# Patient Record
Sex: Female | Born: 1995 | Race: Black or African American | Hispanic: No | Marital: Single | Smoking: Former smoker
Health system: Southern US, Community
[De-identification: ages and names within clinical notes are randomized; demographics above are authoritative.]

## PROBLEM LIST (undated history)

## (undated) ENCOUNTER — Inpatient Hospital Stay (HOSPITAL_COMMUNITY): Payer: Self-pay

## (undated) DIAGNOSIS — D649 Anemia, unspecified: Secondary | ICD-10-CM

## (undated) DIAGNOSIS — A549 Gonococcal infection, unspecified: Secondary | ICD-10-CM

## (undated) DIAGNOSIS — R45851 Suicidal ideations: Secondary | ICD-10-CM

## (undated) DIAGNOSIS — E669 Obesity, unspecified: Secondary | ICD-10-CM

## (undated) DIAGNOSIS — F319 Bipolar disorder, unspecified: Secondary | ICD-10-CM

## (undated) HISTORY — DX: Suicidal ideations: R45.851

## (undated) HISTORY — DX: Gonococcal infection, unspecified: A54.9

## (undated) HISTORY — PX: NO PAST SURGERIES: SHX2092

---
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
## 2013-03-08 LAB — OB RESULTS CONSOLE RPR: RPR: NONREACTIVE

## 2013-03-08 LAB — OB RESULTS CONSOLE HIV ANTIBODY (ROUTINE TESTING): HIV: NONREACTIVE

## 2013-03-08 LAB — OB RESULTS CONSOLE GC/CHLAMYDIA: Gonorrhea: POSITIVE

## 2013-06-09 ENCOUNTER — Encounter (HOSPITAL_COMMUNITY): Payer: Self-pay | Admitting: *Deleted

## 2013-06-09 ENCOUNTER — Inpatient Hospital Stay (HOSPITAL_COMMUNITY)
Admission: AD | Admit: 2013-06-09 | Discharge: 2013-06-09 | Disposition: A | Discharge status: Home / Self Care | Payer: Medicaid Other | Source: Ambulatory Visit | Attending: Obstetrics & Gynecology | Admitting: Obstetrics & Gynecology

## 2013-06-09 DIAGNOSIS — O26899 Other specified pregnancy related conditions, unspecified trimester: Secondary | ICD-10-CM

## 2013-06-09 DIAGNOSIS — N949 Unspecified condition associated with female genital organs and menstrual cycle: Secondary | ICD-10-CM

## 2013-06-09 DIAGNOSIS — O9989 Other specified diseases and conditions complicating pregnancy, childbirth and the puerperium: Secondary | ICD-10-CM

## 2013-06-09 DIAGNOSIS — O99891 Other specified diseases and conditions complicating pregnancy: Secondary | ICD-10-CM | POA: Insufficient documentation

## 2013-06-09 DIAGNOSIS — O0930 Supervision of pregnancy with insufficient antenatal care, unspecified trimester: Secondary | ICD-10-CM | POA: Insufficient documentation

## 2013-06-09 LAB — DIFFERENTIAL
Basophils Absolute: 0 10*3/uL (ref 0.0–0.1)
Basophils Relative: 0 % (ref 0–1)
Eosinophils Absolute: 0.1 10*3/uL (ref 0.0–0.7)
Eosinophils Relative: 1 % (ref 0–5)
Monocytes Absolute: 0.6 10*3/uL (ref 0.1–1.0)
Monocytes Relative: 5 % (ref 3–12)

## 2013-06-09 LAB — COMPREHENSIVE METABOLIC PANEL
ALT: 7 U/L (ref 0–35)
AST: 14 U/L (ref 0–37)
Albumin: 2.5 g/dL — ABNORMAL LOW (ref 3.5–5.2)
CO2: 20 mEq/L (ref 19–32)
Chloride: 104 mEq/L (ref 96–112)
Creatinine, Ser: 0.49 mg/dL — ABNORMAL LOW (ref 0.50–1.10)
GFR calc Af Amer: >90 mL/min (ref >90)
GFR calc non Af Amer: >90 mL/min (ref >90)
Glucose, Bld: 112 mg/dL — ABNORMAL HIGH (ref 70–99)
Potassium: 3.9 mEq/L (ref 3.5–5.1)
Sodium: 135 mEq/L (ref 135–145)
Total Bilirubin: 0.2 mg/dL — ABNORMAL LOW (ref 0.3–1.2)

## 2013-06-09 LAB — URINALYSIS, ROUTINE W REFLEX MICROSCOPIC
Glucose, UA: NEGATIVE mg/dL
Nitrite: NEGATIVE
Protein, ur: NEGATIVE mg/dL

## 2013-06-09 LAB — CBC
HCT: 30.3 % — ABNORMAL LOW (ref 36.0–46.0)
Hemoglobin: 9.5 g/dL — ABNORMAL LOW (ref 12.0–15.0)
MCH: 23.3 pg — ABNORMAL LOW (ref 26.0–34.0)
MCHC: 31.4 g/dL (ref 30.0–36.0)
MCV: 74.3 fL — ABNORMAL LOW (ref 78.0–100.0)
RDW: 16.4 % — ABNORMAL HIGH (ref 11.5–15.5)

## 2013-06-09 LAB — WET PREP, GENITAL
Trich, Wet Prep: NONE SEEN
Yeast Wet Prep HPF POC: NONE SEEN

## 2013-06-09 LAB — RAPID URINE DRUG SCREEN, HOSP PERFORMED
Amphetamines: NOT DETECTED
Opiates: NOT DETECTED

## 2013-06-09 LAB — RPR: RPR Ser Ql: NONREACTIVE

## 2013-06-09 LAB — ABO/RH: ABO/RH(D): O POS

## 2013-06-09 LAB — HIV ANTIBODY (ROUTINE TESTING W REFLEX): HIV: NONREACTIVE

## 2013-06-09 LAB — URINE MICROSCOPIC-ADD ON

## 2013-06-09 LAB — GC/CHLAMYDIA PROBE AMP
CT Probe RNA: NEGATIVE
GC Probe RNA: NEGATIVE

## 2013-06-09 MED ORDER — CYCLOBENZAPRINE HCL 10 MG PO TABS
10.0000 mg | ORAL_TABLET | Freq: Once | ORAL | Status: AC
  Administered 2013-06-09: 10 mg via ORAL

## 2013-06-09 NOTE — MAU Provider Note (Signed)
History     CSN: [id number]  Arrival date and time: 06/09/13 0119   None     Chief Complaint  Patient presents with  . Pelvic Pain   Pelvic Pain The patient's primary symptoms include pelvic pain.    Deborah Hall is a 17 y.o. G1P0 at [redacted]w[redacted]d who presents today with back and lower pelvic pain. She states that she has had the pain since about 2200 yesterday. She denies any VB or LOF and confirms fetal movement. She denies any contractions. She has not had any prenatal care at this point. She states that "maybe she saw someone in [facility]", but her aunt who is with her states that she has not had any prenatal care.   She has an appointment at the health department on Monday, and she has a [facility] appointment tomorrow.    Past Medical History  Diagnosis Date  . Obesity   . Bipolar disorder     History reviewed. No pertinent past surgical history.  Family History  Problem Relation Age of Onset  . Hypertension Mother   . Diabetes Father   . Cancer Neg Hx   . Heart disease Neg Hx   . Stroke Neg Hx     History  Substance Use Topics  . Smoking status: Never Smoker   . Smokeless tobacco: Not on file  . Alcohol Use: No    Allergies: No Known Allergies  Prescriptions prior to admission  Medication Sig Dispense Refill  . Prenatal Vit-Fe Fumarate-FA (MULTIVITAMIN-PRENATAL) 27-0.8 MG TABS tablet Take 1 tablet by mouth daily at 12 noon.      . carbamazepine (TEGRETOL XR) 400 MG 12 hr tablet Take 800 mg by mouth 2 (two) times daily.      Marland Kitchen lamoTRIgine (LAMICTAL) 200 MG tablet Take 200 mg by mouth 2 (two) times daily.      . Norgestimate-Ethinyl Estradiol Triphasic (ORTHO TRI-CYCLEN, 28,) 0.18/0.215/0.25 MG-35 MCG tablet Take 1 tablet by mouth daily.        Review of Systems  Genitourinary: Positive for pelvic pain.   Physical Exam   Blood pressure 125/57, pulse 95, temperature 98.1 F (36.7 C), temperature source Oral, resp. rate 18, height 5\' 4"  (1.626 m), weight 140.161 kg (309  lb).  Physical Exam  Nursing note and vitals reviewed. Constitutional: She appears well-developed and well-nourished. No distress.  Cardiovascular: Normal rate.   Respiratory: Effort normal.  GI: Soft. There is no tenderness.  Genitourinary:   External: no lesion Vagina: small amount of white discharge Cervix: pink, smooth, closed/thick/high  Uterus: AGA    FHT: 140, moderate with 15x15 accels, no decels Toco: no UCs MAU Course  Procedures  Results for orders placed during the hospital encounter of 06/09/13 (from the past 24 hour(s))  URINALYSIS, ROUTINE W REFLEX MICROSCOPIC     Status: Abnormal   Collection Time    06/09/13  1:25 AM      Result Value Range   Color, Urine YELLOW  YELLOW   APPearance CLEAR  CLEAR   Specific Gravity, Urine >1.030 (*) 1.005 - 1.030   pH 6.5  5.0 - 8.0   Glucose, UA NEGATIVE  NEGATIVE mg/dL   Hgb urine dipstick NEGATIVE  NEGATIVE   Bilirubin Urine NEGATIVE  NEGATIVE   Ketones, ur NEGATIVE  NEGATIVE mg/dL   Protein, ur NEGATIVE  NEGATIVE mg/dL   Urobilinogen, UA 0.2  0.0 - 1.0 mg/dL   Nitrite NEGATIVE  NEGATIVE   Leukocytes, UA TRACE (*) NEGATIVE  URINE RAPID  DRUG SCREEN (HOSP PERFORMED)     Status: None   Collection Time    06/09/13  1:25 AM      Result Value Range   Opiates NONE DETECTED  NONE DETECTED   Cocaine NONE DETECTED  NONE DETECTED   Benzodiazepines NONE DETECTED  NONE DETECTED   Amphetamines NONE DETECTED  NONE DETECTED   Tetrahydrocannabinol NONE DETECTED  NONE DETECTED   Barbiturates NONE DETECTED  NONE DETECTED  URINE MICROSCOPIC-ADD ON     Status: Abnormal   Collection Time    06/09/13  1:25 AM      Result Value Range   Squamous Epithelial / LPF FEW (*) RARE   WBC, UA 0-2  <3 WBC/hpf   RBC / HPF 0-2  <3 RBC/hpf   Bacteria, UA MANY (*) RARE   Urine-Other MUCOUS PRESENT    WET PREP, GENITAL     Status: Abnormal   Collection Time    06/09/13  2:10 AM      Result Value Range   Yeast Wet Prep HPF POC NONE SEEN  NONE  SEEN   Trich, Wet Prep NONE SEEN  NONE SEEN   Clue Cells Wet Prep HPF POC MODERATE (*) NONE SEEN   WBC, Wet Prep HPF POC FEW (*) NONE SEEN  CBC     Status: Abnormal   Collection Time    06/09/13  2:55 AM      Result Value Range   WBC 12.2 (*) 4.0 - 10.5 K/uL   RBC 4.08  3.87 - 5.11 MIL/uL   Hemoglobin 9.5 (*) 12.0 - 15.0 g/dL   HCT 98.1 (*) 19.1 - 47.8 %   MCV 74.3 (*) 78.0 - 100.0 fL   MCH 23.3 (*) 26.0 - 34.0 pg   MCHC 31.4  30.0 - 36.0 g/dL   RDW 29.5 (*) 62.1 - 30.8 %   Platelets 336  150 - 400 K/uL  DIFFERENTIAL     Status: Abnormal   Collection Time    06/09/13  2:55 AM      Result Value Range   Neutrophils Relative % 70  43 - 77 %   Neutro Abs 8.5 (*) 1.7 - 7.7 K/uL   Lymphocytes Relative 25  12 - 46 %   Lymphs Abs 3.0  0.7 - 4.0 K/uL   Monocytes Relative 5  3 - 12 %   Monocytes Absolute 0.6  0.1 - 1.0 K/uL   Eosinophils Relative 1  0 - 5 %   Eosinophils Absolute 0.1  0.0 - 0.7 K/uL   Basophils Relative 0  0 - 1 %   Basophils Absolute 0.0  0.0 - 0.1 K/uL     Assessment and Plan   1. Pain of round ligament complicating pregnancy, antepartum    Comfort measures reviewed FU with the health department as planned to start [facility] Labs done today PTL precautions Fetal kick counts.  Tawnya Crook 06/09/2013, 2:06 AM

## 2013-06-09 NOTE — MAU Note (Signed)
Pt reports pain in lower abdomen/pelvis. Pt describes the pain as "tight and sharp". Pt reports pain since 2230. Pt denies bleeding, but reports increase in vag. Discharge.

## 2013-06-12 ENCOUNTER — Encounter: Payer: Self-pay | Admitting: *Deleted

## 2013-06-12 NOTE — MAU Provider Note (Signed)
Attestation of Attending Supervision of Advanced Practitioner (CNM/NP): Evaluation and management procedures were performed by the Advanced Practitioner under my supervision and collaboration. I have reviewed the Advanced Practitioner's note and chart, and I agree with the management and plan.  Lachele Lievanos H. 6:30 AM

## 2013-06-20 ENCOUNTER — Ambulatory Visit (HOSPITAL_COMMUNITY)
Admission: RE | Admit: 2013-06-20 | Discharge: 2013-06-20 | Disposition: A | Discharge status: Home / Self Care | Payer: Medicaid Other | Source: Ambulatory Visit | Attending: Advanced Practice Midwife | Admitting: Advanced Practice Midwife

## 2013-06-20 DIAGNOSIS — O26899 Other specified pregnancy related conditions, unspecified trimester: Secondary | ICD-10-CM

## 2013-06-20 DIAGNOSIS — Z3689 Encounter for other specified antenatal screening: Secondary | ICD-10-CM | POA: Insufficient documentation

## 2013-06-20 DIAGNOSIS — O0930 Supervision of pregnancy with insufficient antenatal care, unspecified trimester: Secondary | ICD-10-CM | POA: Insufficient documentation

## 2013-06-23 LAB — OB RESULTS CONSOLE GBS: GBS: NEGATIVE

## 2013-07-06 ENCOUNTER — Inpatient Hospital Stay (HOSPITAL_COMMUNITY)
Admission: AD | Admit: 2013-07-06 | Discharge: 2013-07-09 | DRG: 775 | Disposition: A | Discharge status: Home / Self Care | Payer: Medicaid Other | Source: Ambulatory Visit | Attending: Obstetrics & Gynecology | Admitting: Obstetrics & Gynecology

## 2013-07-06 ENCOUNTER — Encounter (HOSPITAL_COMMUNITY): Payer: Self-pay | Admitting: *Deleted

## 2013-07-06 ENCOUNTER — Encounter (HOSPITAL_COMMUNITY): Payer: Medicaid Other | Admitting: Anesthesiology

## 2013-07-06 ENCOUNTER — Inpatient Hospital Stay (HOSPITAL_COMMUNITY): Payer: Medicaid Other | Admitting: Anesthesiology

## 2013-07-06 DIAGNOSIS — F319 Bipolar disorder, unspecified: Secondary | ICD-10-CM | POA: Diagnosis present

## 2013-07-06 DIAGNOSIS — O4290 Premature rupture of membranes, unspecified as to length of time between rupture and onset of labor, unspecified weeks of gestation: Principal | ICD-10-CM | POA: Diagnosis present

## 2013-07-06 DIAGNOSIS — O99344 Other mental disorders complicating childbirth: Secondary | ICD-10-CM | POA: Diagnosis present

## 2013-07-06 DIAGNOSIS — E669 Obesity, unspecified: Secondary | ICD-10-CM | POA: Diagnosis present

## 2013-07-06 LAB — POCT FERN TEST: POCT Fern Test: POSITIVE

## 2013-07-06 LAB — CBC
HCT: 31.6 % — ABNORMAL LOW (ref 36.0–46.0)
MCHC: 31.3 g/dL (ref 30.0–36.0)
Platelets: 366 10*3/uL (ref 150–400)

## 2013-07-06 LAB — RAPID URINE DRUG SCREEN, HOSP PERFORMED
Amphetamines: NOT DETECTED
Benzodiazepines: NOT DETECTED
Cocaine: NOT DETECTED
Opiates: NOT DETECTED

## 2013-07-06 LAB — RPR: RPR Ser Ql: NONREACTIVE

## 2013-07-06 MED ORDER — EPHEDRINE 5 MG/ML INJ
10.0000 mg | INTRAVENOUS | Status: DC | PRN
  Filled 2013-07-06: qty 4
  Filled 2013-07-06: qty 2

## 2013-07-06 MED ORDER — DIPHENHYDRAMINE HCL 50 MG/ML IJ SOLN
12.5000 mg | INTRAMUSCULAR | Status: DC | PRN
  Administered 2013-07-06 (×2): 12.5 mg via INTRAVENOUS
  Filled 2013-07-06 (×2): qty 1

## 2013-07-06 MED ORDER — NALBUPHINE SYRINGE 5 MG/0.5 ML
5.0000 mg | INJECTION | INTRAMUSCULAR | Status: DC | PRN
  Administered 2013-07-06: 5 mg via INTRAVENOUS
  Filled 2013-07-06 (×2): qty 0.5

## 2013-07-06 MED ORDER — ACETAMINOPHEN 325 MG PO TABS: 650.0000 mg | ORAL_TABLET | ORAL | Status: DC | PRN

## 2013-07-06 MED ORDER — ONDANSETRON HCL 4 MG/2ML IJ SOLN: 4.0000 mg | Freq: Four times a day (QID) | INTRAMUSCULAR | Status: DC | PRN

## 2013-07-06 MED ORDER — PHENYLEPHRINE 40 MCG/ML (10ML) SYRINGE FOR IV PUSH (FOR BLOOD PRESSURE SUPPORT)
80.0000 ug | PREFILLED_SYRINGE | INTRAVENOUS | Status: DC | PRN
  Filled 2013-07-06: qty 10
  Filled 2013-07-06: qty 2

## 2013-07-06 MED ORDER — FENTANYL CITRATE 0.05 MG/ML IJ SOLN
50.0000 ug | INTRAMUSCULAR | Status: DC | PRN
  Administered 2013-07-06: 50 ug via INTRAVENOUS
  Filled 2013-07-06: qty 2

## 2013-07-06 MED ORDER — FLEET ENEMA 7-19 GM/118ML RE ENEM: 1.0000 | ENEMA | RECTAL | Status: DC | PRN

## 2013-07-06 MED ORDER — PROMETHAZINE HCL 25 MG/ML IJ SOLN
12.5000 mg | INTRAMUSCULAR | Status: DC | PRN
  Administered 2013-07-06: 12.5 mg via INTRAVENOUS
  Filled 2013-07-06: qty 1

## 2013-07-06 MED ORDER — OXYTOCIN 40 UNITS IN LACTATED RINGERS INFUSION - SIMPLE MED
1.0000 m[IU]/min | INTRAVENOUS | Status: DC
  Administered 2013-07-06: 2 m[IU]/min via INTRAVENOUS
  Filled 2013-07-06 (×2): qty 1000

## 2013-07-06 MED ORDER — OXYCODONE-ACETAMINOPHEN 5-325 MG PO TABS: 1.0000 | ORAL_TABLET | ORAL | Status: DC | PRN

## 2013-07-06 MED ORDER — ZOLPIDEM TARTRATE 5 MG PO TABS
5.0000 mg | ORAL_TABLET | Freq: Every evening | ORAL | Status: DC | PRN
Start: 2013-07-06 — End: 2013-07-07

## 2013-07-06 MED ORDER — MISOPROSTOL 200 MCG PO TABS
50.0000 ug | ORAL_TABLET | ORAL | Status: DC
  Administered 2013-07-06 (×2): 50 ug via ORAL
  Filled 2013-07-06 (×3): qty 0.5

## 2013-07-06 MED ORDER — LACTATED RINGERS IV SOLN: 500.0000 mL | Freq: Once | INTRAVENOUS | Status: DC

## 2013-07-06 MED ORDER — EPHEDRINE 5 MG/ML INJ
10.0000 mg | INTRAVENOUS | Status: DC | PRN
  Filled 2013-07-06: qty 2

## 2013-07-06 MED ORDER — OXYTOCIN BOLUS FROM INFUSION
500.0000 mL | INTRAVENOUS | Status: DC
  Administered 2013-07-07: 500 mL via INTRAVENOUS

## 2013-07-06 MED ORDER — TERBUTALINE SULFATE 1 MG/ML IJ SOLN: 0.2500 mg | Freq: Once | INTRAMUSCULAR | Status: AC | PRN

## 2013-07-06 MED ORDER — IBUPROFEN 600 MG PO TABS
600.0000 mg | ORAL_TABLET | Freq: Four times a day (QID) | ORAL | Status: DC | PRN
  Administered 2013-07-07: 600 mg via ORAL
  Filled 2013-07-06: qty 1

## 2013-07-06 MED ORDER — LIDOCAINE HCL (PF) 1 % IJ SOLN
30.0000 mL | INTRAMUSCULAR | Status: DC | PRN
  Filled 2013-07-06 (×2): qty 30

## 2013-07-06 MED ORDER — FENTANYL 2.5 MCG/ML BUPIVACAINE 1/10 % EPIDURAL INFUSION (WH - ANES)
14.0000 mL/h | INTRAMUSCULAR | Status: DC | PRN
  Administered 2013-07-06 – 2013-07-07 (×3): 14 mL/h via EPIDURAL
  Filled 2013-07-06 (×3): qty 125

## 2013-07-06 MED ORDER — SODIUM BICARBONATE 8.4 % IV SOLN
INTRAVENOUS | Status: DC | PRN
  Administered 2013-07-06: 5 mL via EPIDURAL

## 2013-07-06 MED ORDER — PHENYLEPHRINE 40 MCG/ML (10ML) SYRINGE FOR IV PUSH (FOR BLOOD PRESSURE SUPPORT)
80.0000 ug | PREFILLED_SYRINGE | INTRAVENOUS | Status: DC | PRN
  Filled 2013-07-06: qty 2

## 2013-07-06 MED ORDER — CITRIC ACID-SODIUM CITRATE 334-500 MG/5ML PO SOLN: 30.0000 mL | ORAL | Status: DC | PRN

## 2013-07-06 MED ORDER — LACTATED RINGERS IV SOLN
INTRAVENOUS | Status: DC
  Administered 2013-07-06 – 2013-07-07 (×4): via INTRAVENOUS

## 2013-07-06 MED ORDER — OXYTOCIN 40 UNITS IN LACTATED RINGERS INFUSION - SIMPLE MED: 62.5000 mL/h | INTRAVENOUS | Status: DC

## 2013-07-06 MED ORDER — LACTATED RINGERS IV SOLN
500.0000 mL | INTRAVENOUS | Status: DC | PRN
  Administered 2013-07-06: 500 mL via INTRAVENOUS

## 2013-07-06 NOTE — Progress Notes (Addendum)
Catelynn Sparger is a 17 y.o. G2P0010 at [redacted]w[redacted]d by ultrasound admitted for induction of labor due to PPROM (12/18 0300).  Subjective: Pt feeling occasional pressure. Comfortable s/p epidural.   Objective: BP 108/56  Pulse 74  Temp(Src) 98.5 F (36.9 C) (Oral)  Resp 24  Ht 5\' 4"  (1.626 m)  Wt 141.159 kg (311 lb 3.2 oz)  BMI 53.39 kg/m2  SpO2 100%      FHT:  FHR: 140 bpm, variability: min-mod,  accelerations:  Present,  decelerations:  Present intermittent variables UC:   irregular, every 2-3 minutes SVE:   Dilation: 2.5 Effacement (%): 60 Station: -1 Exam by:: Marijean Heath, Rn  Labs: Lab Results  Component Value Date   WBC 12.4* 07/06/2013   HGB 9.9* 07/06/2013   HCT 31.6* 07/06/2013   MCV 73.7* 07/06/2013   PLT 366 07/06/2013   Assessment / Plan: Induction of labor due to PPROM,  progressing well on pitocin  Labor: Progressing on Pitocin, will continue to increase. Preeclampsia:  No signs or symptoms Fetal Wellbeing:  Category II Pain Control:  Epidural I/D:  GBS neg Anticipated MOD:  NSVD  Hazeline Junker 07/06/2013, 7:05 PM

## 2013-07-06 NOTE — MAU Note (Signed)
Pt states had large gush of clear fluid at 0300. Positive fetal movement. Some pain but not sure if having contractions.

## 2013-07-06 NOTE — Anesthesia Preprocedure Evaluation (Signed)
Anesthesia Evaluation  Patient identified by MRN, date of birth, ID band Patient awake    Reviewed: Allergy & Precautions, H&P , Patient's Chart, lab work & pertinent test results  Airway Mallampati: II TM Distance: >3 FB Neck ROM: full    Dental  (+) Teeth Intact   Pulmonary  breath sounds clear to auscultation        Cardiovascular Rhythm:regular Rate:Normal     Neuro/Psych    GI/Hepatic   Endo/Other  Morbid obesity  Renal/GU      Musculoskeletal   Abdominal   Peds  Hematology  (+) anemia ,   Anesthesia Other Findings       Reproductive/Obstetrics (+) Pregnancy                           Anesthesia Physical Anesthesia Plan  ASA: III  Anesthesia Plan: Epidural   Post-op Pain Management:    Induction:   Airway Management Planned:   Additional Equipment:   Intra-op Plan:   Post-operative Plan:   Informed Consent: I have reviewed the patients History and Physical, chart, labs and discussed the procedure including the risks, benefits and alternatives for the proposed anesthesia with the patient or authorized representative who has indicated his/her understanding and acceptance.   Dental Advisory Given  Plan Discussed with:   Anesthesia Plan Comments: (Labs checked- platelets confirmed with RN in room. Fetal heart tracing, per RN, reported to be stable enough for sitting procedure. Discussed epidural, and patient consents to the procedure:  included risk of possible headache,backache, failed block, allergic reaction, and nerve injury. This patient was asked if she had any questions or concerns before the procedure started.)        Anesthesia Quick Evaluation  

## 2013-07-06 NOTE — H&P (Signed)
Deborah Hall is a 17 y.o. female G2P0010 at 38.5wks presenting for ROM @ 0300 for clear fluid. She denies ctx since that time; no bleeding or other concerns. No H/A or N/V/D. Her preg has been followed by the [facility] with onset of care at 35wks due to pt 'moving around'. She has a sx mental health hx including hospitalization at a mental facility at the ages of 40 and 25. Has bipolar and depression; was on Tegretol and Lamictal until April when she stopped due to pregnancy. History OB History   Grav Para Term Preterm Abortions TAB SAB Ect Mult Living   2 0 0 0 1 0 1 0 0 0      Past Medical History  Diagnosis Date  . Obesity   . Bipolar disorder   . Suicidal ideations   . Gonorrhea 2014 pregnancy     treated   History reviewed. No pertinent past surgical history. Family History: family history includes Diabetes in her father; Hypertension in her mother. There is no history of Cancer, Heart disease, or Stroke. Social History:  reports that she has never smoked. She does not have any smokeless tobacco history on file. She reports that she does not drink alcohol or use illicit drugs.   Prenatal Transfer Tool  Maternal Diabetes: No Genetic Screening: Declined Maternal Ultrasounds/Referrals: Normal Fetal Ultrasounds or other Referrals:  None Maternal Substance Abuse:  No Significant Maternal Medications:  None Significant Maternal Lab Results:  Lab values include: Group B Strep negative Other Comments:  onset of PNC at 35wks; on Tegretol and Lamictal in 1st tri  ROS    Blood pressure 147/79, pulse 97, temperature 98.5 F (36.9 C), temperature source Oral, resp. rate 20, height 5\' 4"  (1.626 m), weight 141.159 kg (311 lb 3.2 oz), SpO2 100.00%. Exam Physical Exam  Constitutional: She is oriented to person, place, and time.  Obese  HENT:  Head: Normocephalic.  Neck: Normal range of motion.  Cardiovascular: Normal rate.   Respiratory: Effort normal.  GI:  EFM 140's, some 10x10accels,  no decels Rare irritability per toco  Genitourinary: Vagina normal.  Copious clear fluid present Cx C/L  Musculoskeletal: Normal range of motion.  Neurological: She is alert and oriented to person, place, and time.  Skin: Skin is warm and dry.  Psychiatric: She has a normal mood and affect. Her behavior is normal. Thought content normal.    Prenatal labs: ABO, Rh: --/--/O POS, O POS (11/21 0255) Antibody: NEG (11/21 0255) Rubella: 1.12 (11/21 0255) RPR: NON REACTIVE (11/21 0255)  HBsAg: NEGATIVE (11/21 0255)  HIV: NON REACTIVE (11/21 0255)  GBS: Negative (12/05 0000)   Assessment/Plan: IUP at 38.5wks PROM GBS neg  Admit to [facility] Will use cytotec PO for ripening Anticipate SVD   Tonyetta Berko 07/06/2013, 6:47 AM  I have seen and examined this patient and I agree with the above. Cam Hai 8:22 AM 07/06/2013

## 2013-07-06 NOTE — Anesthesia Procedure Notes (Signed)
Epidural Patient location during procedure: OB  Preanesthetic Checklist Completed: patient identified, site marked, surgical consent, pre-op evaluation, timeout performed, IV checked, risks and benefits discussed and monitors and equipment checked  Epidural Patient position: sitting Prep: site prepped and draped and DuraPrep Patient monitoring: continuous pulse ox and blood pressure Approach: midline Injection technique: LOR air  Needle:  Needle type: Tuohy  Needle gauge: 17 G Needle length: 9 cm and 9 Needle insertion depth: 9 cm Catheter type: closed end flexible Catheter size: 19 Gauge Catheter at skin depth: 18 cm Test dose: negative  Assessment Events: blood not aspirated, injection not painful, no injection resistance, negative IV test and no paresthesia  Additional Notes Dosing of Epidural:  1st dose, through catheter ............................................. epi 1:200K + Xylocaine 40 mg  2nd dose, through catheter, after waiting 3 minutes.....epi 1:200K + Xylocaine 60 mg    ( 2% Xylo charted as a single dose in Epic Meds for ease of charting; actual dosing was fractionated as above, for saftey's sake)  As each dose occurred, patient was free of IV sx; and patient exhibited no evidence of SA injection.  Patient is more comfortable after epidural dosed. Please see RN's note for documentation of vital signs,and FHR which are stable.  Patient reminded not to try to ambulate with numb legs, and that an RN must be present when she attempts to get up.       

## 2013-07-06 NOTE — Progress Notes (Signed)
Patient ID: Deborah Hall, female   DOB: [birth year], 17 y.o.   MRN: [id number]  Filed Vitals:   07/06/13 0932 07/06/13 1135 07/06/13 1213 07/06/13 1326  BP:  122/55    Pulse:  74 67   Temp: 98.5 F (36.9 C) 97.4 F (36.3 C)  98.6 F (37 C)  TempSrc:      Resp:  22    Height:      Weight:      SpO2:   99%    Doing well but IV meds not working, wants epidural  FHR stable and reassuring UCs every 2-4 minutes  Cervix deferred  Will get epidural

## 2013-07-06 NOTE — Progress Notes (Signed)
Patient ID: Deborah Hall, female   DOB: 11-10-1995, 17 y.o.   MRN: [id number]  Filed Vitals:   07/06/13 1445 07/06/13 1449 07/06/13 1451 07/06/13 1529  BP:   104/55   Pulse: 72 81 73 88  Temp:      TempSrc:      Resp:   32   Height:      Weight:      SpO2: 100% 100%  97%    Doing well with epidural  FHR stable, small accels UCs q 6-8 min, with some coupling  Dilation: 1.5 Effacement (%): 60 Cervical Position: Middle Station: -1 Presentation: Vertex Exam by:: Marijean Heath, RN  Will convert to Pitocin Augmentation

## 2013-07-06 NOTE — Progress Notes (Signed)
Deborah Hall is a 17 y.o. G2P0010 at [redacted]w[redacted]d by ultrasound admitted for induction of labor due to PPROM.  Subjective: Feeling a lot more pain with UCs now. Walking around  Objective: BP 122/55  Pulse 74  Temp(Src) 97.5 F (36.4 C) (Oral)  Resp 22  Ht 5\' 4"  (1.626 m)  Wt 141.159 kg (311 lb 3.2 oz)  BMI 53.39 kg/m2  SpO2 100%      FHT:  FHR: 140 bpm, variability: moderate,  accelerations:  Present,  decelerations:  Absent UC:   irregular, every 3 minutes SVE:   Dilation: Closed Effacement (%): Thick Station: -3 Exam by:: Clelia Croft, CNM  Labs: Lab Results  Component Value Date   WBC 12.4* 07/06/2013   HGB 9.9* 07/06/2013   HCT 31.6* 07/06/2013   MCV 73.7* 07/06/2013   PLT 366 07/06/2013    Assessment / Plan: Induction of labor due to PPROM,  progressing well on pitocin  Labor: Progressing normally Preeclampsia:  n/a Fetal Wellbeing:  Category I Pain Control:  Labor support without medications I/D:  n/a Anticipated MOD:  NSVD  Deborah Hall 07/06/2013, 12:00 PM

## 2013-07-07 ENCOUNTER — Encounter (HOSPITAL_COMMUNITY): Payer: Self-pay | Admitting: Obstetrics

## 2013-07-07 DIAGNOSIS — O4290 Premature rupture of membranes, unspecified as to length of time between rupture and onset of labor, unspecified weeks of gestation: Secondary | ICD-10-CM

## 2013-07-07 LAB — TYPE AND SCREEN: ABO/RH(D): O POS

## 2013-07-07 MED ORDER — ONDANSETRON HCL 4 MG PO TABS: 4.0000 mg | ORAL_TABLET | ORAL | Status: DC | PRN

## 2013-07-07 MED ORDER — SIMETHICONE 80 MG PO CHEW: 80.0000 mg | CHEWABLE_TABLET | ORAL | Status: DC | PRN

## 2013-07-07 MED ORDER — BUPIVACAINE HCL (PF) 0.25 % IJ SOLN
INTRAMUSCULAR | Status: DC | PRN
  Administered 2013-07-07: 5 mL via EPIDURAL

## 2013-07-07 MED ORDER — DIBUCAINE 1 % RE OINT: 1.0000 "application " | TOPICAL_OINTMENT | RECTAL | Status: DC | PRN

## 2013-07-07 MED ORDER — SENNOSIDES-DOCUSATE SODIUM 8.6-50 MG PO TABS
2.0000 | ORAL_TABLET | ORAL | Status: DC
  Administered 2013-07-08 (×2): 2 via ORAL
  Filled 2013-07-07 (×2): qty 2

## 2013-07-07 MED ORDER — ONDANSETRON HCL 4 MG/2ML IJ SOLN: 4.0000 mg | INTRAMUSCULAR | Status: DC | PRN

## 2013-07-07 MED ORDER — ZOLPIDEM TARTRATE 5 MG PO TABS: 5.0000 mg | ORAL_TABLET | Freq: Every evening | ORAL | Status: DC | PRN

## 2013-07-07 MED ORDER — TETANUS-DIPHTH-ACELL PERTUSSIS 5-2.5-18.5 LF-MCG/0.5 IM SUSP: 0.5000 mL | Freq: Once | INTRAMUSCULAR | Status: DC

## 2013-07-07 MED ORDER — OXYCODONE-ACETAMINOPHEN 5-325 MG PO TABS
1.0000 | ORAL_TABLET | ORAL | Status: DC | PRN
  Administered 2013-07-08: 1 via ORAL
  Filled 2013-07-07 (×2): qty 1

## 2013-07-07 MED ORDER — LANOLIN HYDROUS EX OINT: TOPICAL_OINTMENT | CUTANEOUS | Status: DC | PRN

## 2013-07-07 MED ORDER — PRENATAL MULTIVITAMIN CH
1.0000 | ORAL_TABLET | Freq: Every day | ORAL | Status: DC
  Administered 2013-07-07 – 2013-07-08 (×2): 1 via ORAL
  Filled 2013-07-07 (×2): qty 1

## 2013-07-07 MED ORDER — IBUPROFEN 600 MG PO TABS
600.0000 mg | ORAL_TABLET | Freq: Four times a day (QID) | ORAL | Status: DC
  Administered 2013-07-07 – 2013-07-09 (×8): 600 mg via ORAL
  Filled 2013-07-07 (×8): qty 1

## 2013-07-07 MED ORDER — WITCH HAZEL-GLYCERIN EX PADS: 1.0000 "application " | MEDICATED_PAD | CUTANEOUS | Status: DC | PRN

## 2013-07-07 MED ORDER — BENZOCAINE-MENTHOL 20-0.5 % EX AERO
1.0000 "application " | INHALATION_SPRAY | CUTANEOUS | Status: DC | PRN
  Administered 2013-07-07: 1 via TOPICAL
  Filled 2013-07-07: qty 56

## 2013-07-07 MED ORDER — DIPHENHYDRAMINE HCL 25 MG PO CAPS: 25.0000 mg | ORAL_CAPSULE | Freq: Four times a day (QID) | ORAL | Status: DC | PRN

## 2013-07-07 NOTE — Progress Notes (Signed)
UR chart review completed.  

## 2013-07-07 NOTE — H&P (Signed)
`````  Attestation of Attending Supervision of Advanced Practitioner: Evaluation and management procedures were performed by the PA/NP/CNM/OB Fellow under my supervision/collaboration. Chart reviewed and agree with management and plan.  Radin Raptis V 07/07/2013 8:08 PM

## 2013-07-07 NOTE — Progress Notes (Signed)
Porshea Janowski is a 17 y.o. G2P0010 at [redacted]w[redacted]d admitted for induction of labor due to Spontaneous rupture of BOW.  Subjective: Pt is comfortable on epidural but feeling pressure in her bottom with contractions. No complaints.  Objective: BP 130/65  Pulse 93  Temp(Src) 98.7 F (37.1 C) (Oral)  Resp 18  Ht 5\' 4"  (1.626 m)  Wt 141.159 kg (311 lb 3.2 oz)  BMI 53.39 kg/m2  SpO2 100%   FHT:  FHR: 140 bpm, variability: moderate,  accelerations:  Abscent,  decelerations:  Absent UC:   regular, every 1-3 minutes SVE:   Dilation: 3 Effacement (%): 90 Station: -2 Exam by:: Dr. Corrin Parker  Labs: Lab Results  Component Value Date   WBC 12.4* 07/06/2013   HGB 9.9* 07/06/2013   HCT 31.6* 07/06/2013   MCV 73.7* 07/06/2013   PLT 366 07/06/2013    Assessment / Plan: Induction of labor due to PROM,  progressing well on pitocin  Labor: Progressing on Pitocin, will continue to increase to then if no progress consider cervical ripening and pit rest Preeclampsia:  n/a Fetal Wellbeing:  Category I Pain Control:  Epidural I/D:  GBS neg Anticipated MOD:  NSVD  Mari Battaglia, Jearld Lesch 07/07/2013, 12:16 AM

## 2013-07-07 NOTE — Anesthesia Postprocedure Evaluation (Signed)
Anesthesia Post Note  Patient: Deborah Hall  Procedure(s) Performed: * No procedures listed *  Anesthesia type: Epidural  Patient location: Mother/Baby  Post pain: Pain level controlled  Post assessment: Post-op Vital signs reviewed  Last Vitals:  Filed Vitals:   07/07/13 1354  BP: 122/71  Pulse: 77  Temp: 36.7 C  Resp: 18    Post vital signs: Reviewed  Level of consciousness:alert  Complications: No apparent anesthesia complications

## 2013-07-08 MED ORDER — IBUPROFEN 600 MG PO TABS: 600.0000 mg | ORAL_TABLET | Freq: Four times a day (QID) | ORAL | Status: DC

## 2013-07-08 NOTE — Progress Notes (Signed)
Clinical Social Work Department PSYCHOSOCIAL ASSESSMENT - MATERNAL/CHILD 07/08/2013  Patient:  Deborah Hall,Deborah Hall  Account Number:  [id number]  Admit Date:  07/06/2013  Childs Name:   Deborah Hall    Clinical Social Worker:  Anamae Rochelle, LCSW   Date/Time:  07/08/2013 04:00 PM  Date Referred:  07/07/2013   Referral source  Central Nursery     Referred reason  LPNC   Other referral source:    I:  FAMILY / HOME ENVIRONMENT Child's legal guardian:  PARENT  Guardian - Name Guardian - Age Guardian - Address  Hall,Deborah 17 [address]  [place], [place] [address]  Hall, Shontah 22    Other household support members/support persons Other support:    II  PSYCHOSOCIAL DATA Information Source:    Financial and Community Resources Employment:   Financial resources:  Medicaid If Medicaid - County:   Other  WIC   School / Grade:   Maternity Care Coordinator / Child Services Coordination / Early Interventions:  Cultural issues impacting care:    III  STRENGTHS Strengths  Adequate Resources  Supportive family/friends  Home prepared for Child (including basic supplies)   Strength comment:    IV  RISK FACTORS AND CURRENT PROBLEMS Current Problem:       V  SOCIAL WORK ASSESSMENT Acknowledged order for Social Work consult to assess pt's history of Depression Bipolar, and limited PNC. Met with mother who was pleasant and receptive to social work intervention.  She is a single parent with no other dependents.  Informed that she and FOB are no longer in a relationship, but he has been very supportive of newborn. FOB is reportedly employed.  Mother reports hx of bipolar disorder.  Informed that she has been in and out of group homes from age 10 to 16.  She is reportedly on SSI for the bipolar.  Per chart review, mother has hx of suicide attempt in 2013.  Discussed this with her.  She stated "I overdosed on my medication".  She could not recall when but said it was longer than  2013.  She denies current symptoms of depression or anxiety.  Mother states that I have been on meds for so long that I now recognize when I am headed towards a crisis.  She has an appointment with the Psychiatrist at [facility] Jan. 13, 2015 and communicates intent to get back on her medication.   Mother states that she and newborn will be living with maternal grandmother. Informed that she and her mother did not get along when she was younger.  She reportedly moved in with her mother about 4 months ago.  "My mom loves this baby".   Prior to that she did not have stable housing and limited PNC because she moved around a lot.  Mother denies any hx of substance abuse.  Nursing staff communicated concerns regarding mother bounding with newborn.  Limited interaction was observed with newborn according to the nursed.  When this writer entered the room, mother was holding the baby. There was no one else in the room.  She spoke positively about her baby and how "she meant the world to her".   She held the baby during the entire time CSW was in the room. Mother's affect and behavior was appropriate during CSW visit.      VI SOCIAL WORK PLAN  Type of pt/family education:   Information on PP Depression  Importance of being compliant with mental health treatment   If child protective services report -   county:   If child protective services report - date:   Information/referral to community resources comment:   Other social work plan:    Wyn Nettle J, LCSW  

## 2013-07-08 NOTE — Discharge Summary (Signed)
Obstetric Discharge Summary Reason for Admission: rupture of membranes Prenatal Procedures: none Intrapartum Procedures: spontaneous vaginal delivery Postpartum Procedures: none Complications-Operative and Postpartum: none Hemoglobin  Date Value Range Status  07/06/2013 9.9* 12.0 - 15.0 g/dL Final  4/0/9811 91.4   Final     HCT  Date Value Range Status  07/06/2013 31.6* 36.0 - 46.0 % Final  11/23/2012 34   Final   Brief Hospital Course Pt is 17 yo G2 now P1011 s/p vaginal delivery s/p IOL for ruptured membranes. She had no postpartum complications. On day of discharge she is ambulating well, tolerating PO, urinating, appropriate bleeding and her pain is well controlled. She is bottle-feeding and is considering Mirena for contraception.  Physical Exam:  General: alert, cooperative and no distress Lochia: appropriate Uterine Fundus: firm Incision: n/a DVT Evaluation: No evidence of DVT seen on physical exam. Negative Homan's sign. No cords or calf tenderness. No significant calf/ankle edema.  Discharge Diagnoses: Term Pregnancy-delivered  Discharge Information: Date: 07/08/2013 Activity: pelvic rest Diet: routine Medications: PNV and Ibuprofen Condition: stable Instructions: refer to practice specific booklet Discharge to: home   Newborn Data: Live born female  Birth Weight: 6 lb 14.2 oz (3125 g) APGAR: 9, 9  Home with mother.  Pior, Jearld Lesch 07/08/2013, 8:30 AM  I have seen and examined this patient and I agree with the above. Cam Hai 7:39 PM 07/14/2013

## 2013-07-09 NOTE — Discharge Summary (Signed)
Obstetric Discharge Summary Reason for Admission: rupture of membranes Prenatal Procedures: ultrasound Intrapartum Procedures: spontaneous vaginal delivery Postpartum Procedures: none Complications-Operative and Postpartum: none Eating, drinking, voiding, ambulating well.  +flatus.  Lochia and pain wnl.  Denies dizziness, lightheadedness, or sob. No complaints. H/O bipolar, used to take tegretol and lamictal when she was 17yo per report, admission H&P states she stopped them when she found out about pregnancy, she doesn't know dosages, feels like she needs to be back on something. Has pysch eval on 1/13 per her report. Denies current feelings of depression or SI/HI/II. Has had SW consult this admission, no barriers to d/c documented.  Hemoglobin  Date Value Range Status  07/06/2013 9.9* 12.0 - 15.0 g/dL Final  07/25/1094 04.5   Final     HCT  Date Value Range Status  07/06/2013 31.6* 36.0 - 46.0 % Final  11/23/2012 34   Final    Physical Exam:  General: alert, cooperative and no distress Lochia: appropriate Uterine Fundus: firm Incision: n/a DVT Evaluation: No evidence of DVT seen on physical exam. Negative Homan's sign. No cords or calf tenderness. No significant calf/ankle edema.  Discharge Diagnoses: Term Pregnancy-delivered  Discharge Information: Date: 07/09/2013 Activity: pelvic rest Diet: routine Medications: PNV and Ibuprofen Condition: stable Instructions: refer to practice specific booklet Discharge to: home Follow-up Information   Follow up with [facility] [place]. Schedule an appointment as soon as possible for a visit today. (4-6 weeks for your postpartum visit, and also an appointment to have your nexplanon placed. Do not have sex until nexplanon is placed.)    Contact information:   [address] [place] [address] [phone]      Newborn Data: Live born female  Birth Weight: 6 lb 14.2 oz (3125 g) APGAR: 9, 9  Home with mother.   Bottlefeeding. Plans nexplanon for contraception, understands to remain abstinent until it is placed.   Discussed pt's request to restart bipolar meds w/ attendings- who aren't comfortable w/ [place] reinitiating bipolar meds since she has been off of them entire pregnancy, recommend pt to call OP behavioral health today or tomorrow to get ASAP appt to reinitiated meds if needed.  Numbers to [facility] given to pt on printed d/c papers.  Reviewed PPD s/s, instructed pt to call HD or return to [facility] for PPD s/s, SI/HI/II.   Marge Duncans 07/09/2013, 7:31 AM

## 2013-07-17 NOTE — Discharge Summary (Signed)
`````  Attestation of Attending Supervision of Advanced Practitioner: Evaluation and management procedures were performed by the PA/NP/CNM/OB Fellow under my supervision/collaboration. Chart reviewed and agree with management and plan.  Bradee Common V 07/17/2013 4:46 PM

## 2014-05-21 ENCOUNTER — Encounter (HOSPITAL_COMMUNITY): Payer: Self-pay | Admitting: Obstetrics

## 2014-07-20 NOTE — L&D Delivery Note (Signed)
Delivery Note At 7:14 PM a viable, healthy female was delivered via Vaginal, Spontaneous Delivery (Presentation: Left Occiput Anterior).  APGAR: , ; weight  .   Placenta status: spontaneous, grossly intact.  Cord: 3 vessels with the following complications: None.   Anesthesia: Epidural  Episiotomy: None Lacerations: Periurethral abrasion Suture Repair: None Est. Blood Loss (mL): 100  Mom to postpartum.  Baby to Couplet care / Skin to Skin.  Olivia Cantermanda Schultz 06/24/2015, 7:26 PM   Had variable decels following sudden progression to 9+cm Pushed one or two times to SVD No difficulty with shoulders I was gloved and present for entire delivery Aviva SignsMarie L Mansur Patti, CNM

## 2014-10-01 IMAGING — US US OB COMP +14 WK
1 series · 14 of 28 positions shown · non-contrast
Comparison: none

[Series 1: us ob comp +14 wk · 64 acquisitions, 14 frames shown]
[im 3/64]
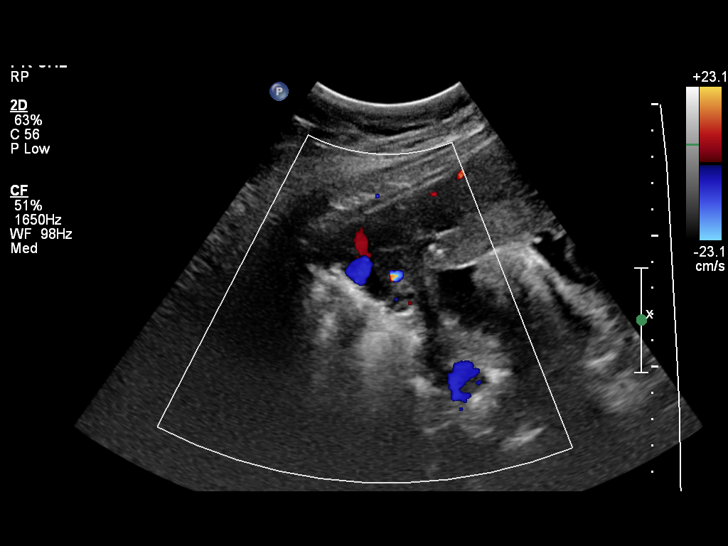
[im 8/64]
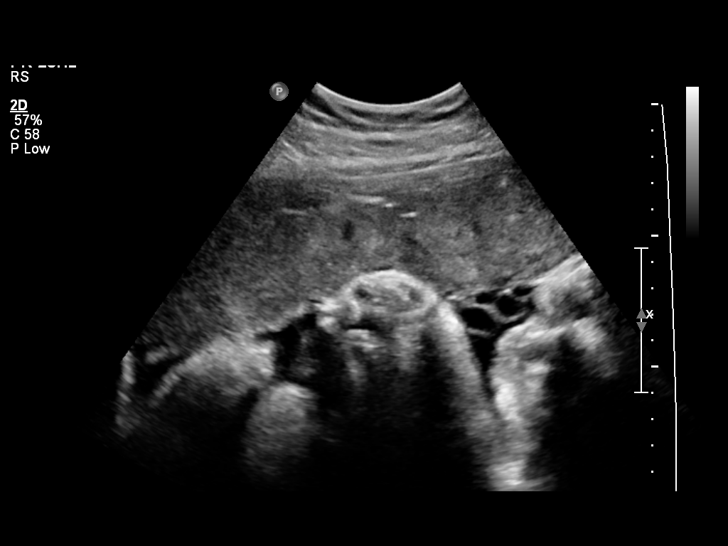
[im 12/64]
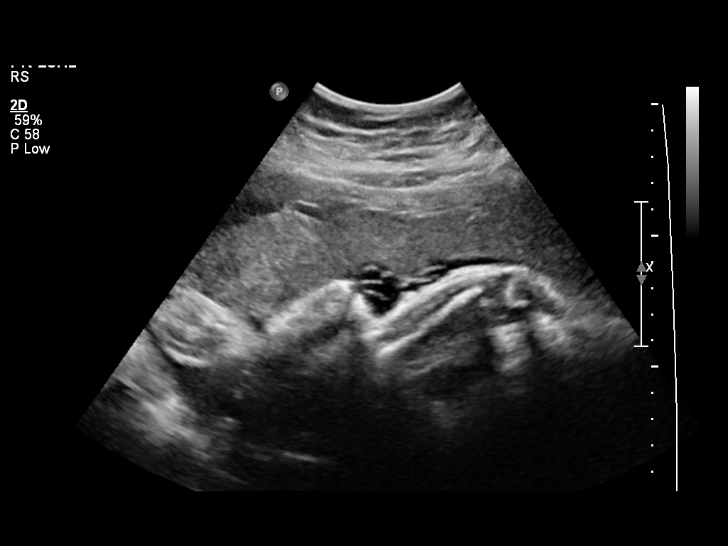
[im 17/64]
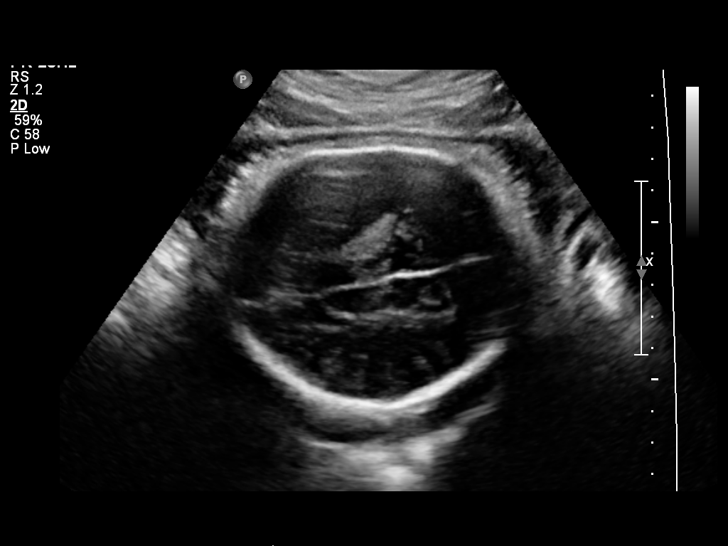
[im 22/64]
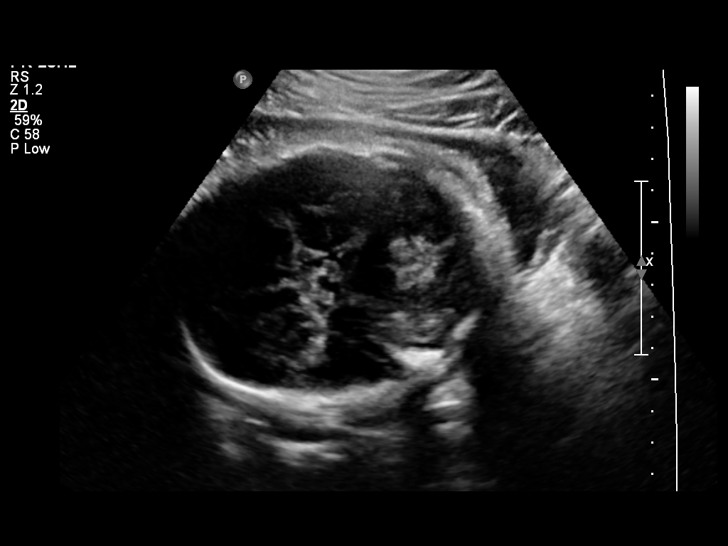
[im 26/64]
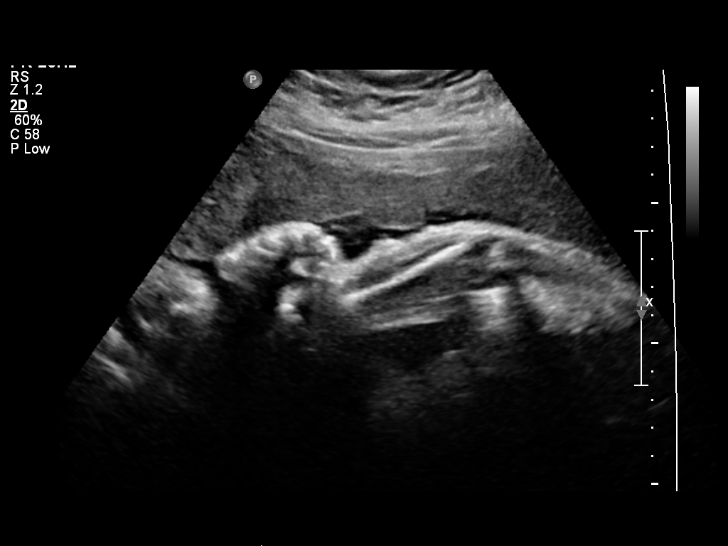
[im 31/64]
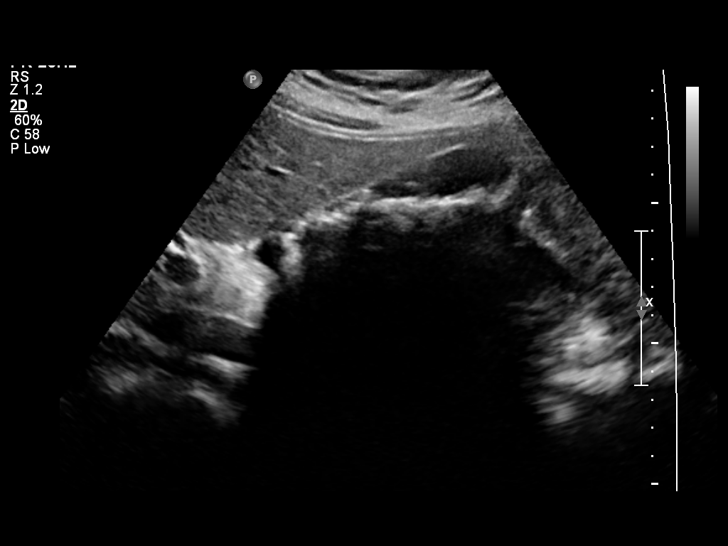
[im 36/64]
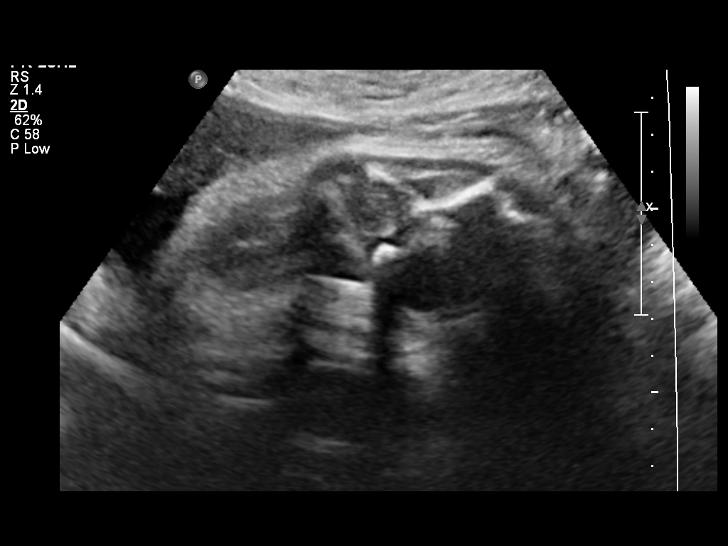
[im 40/64]
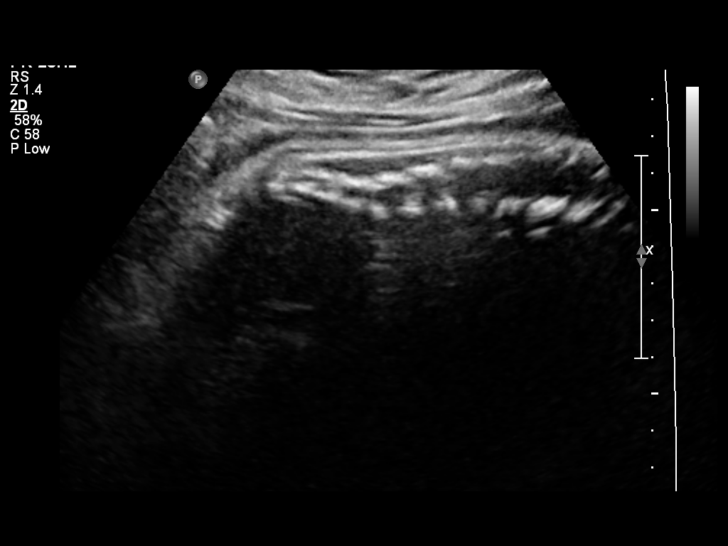
[im 45/64]
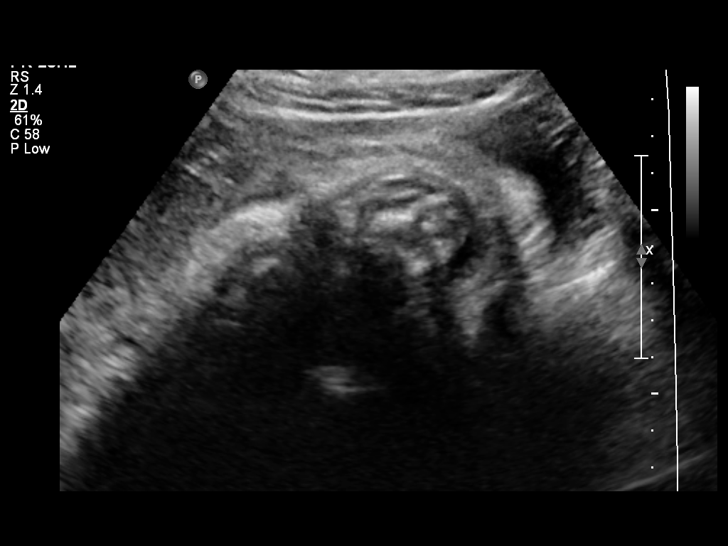
[im 50/64]
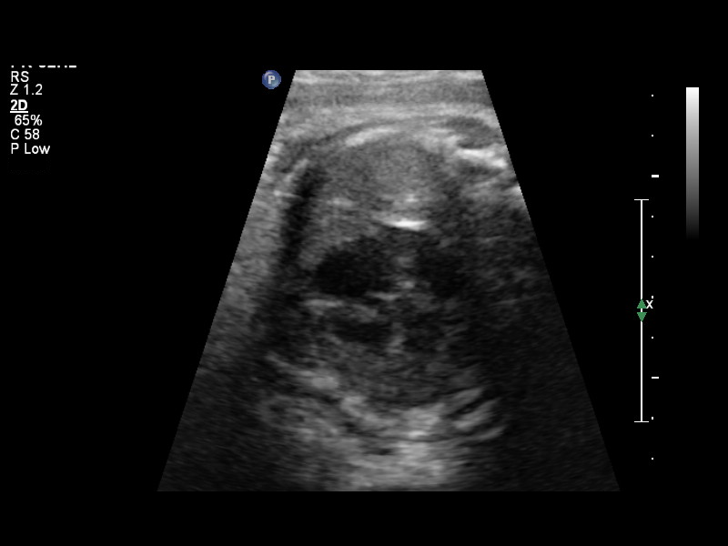
[im 54/64]
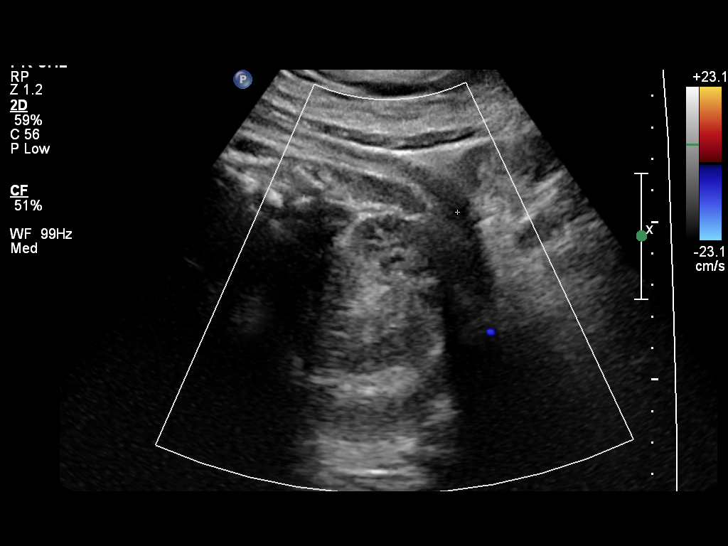
[im 59/64]
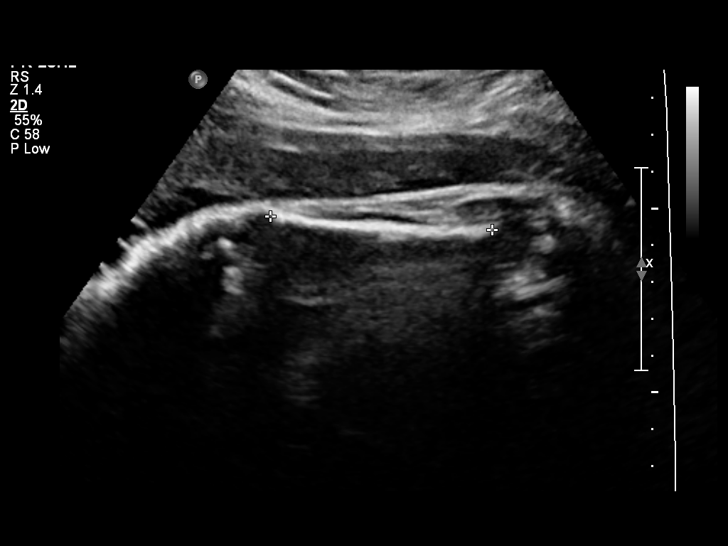
[im 64/64]
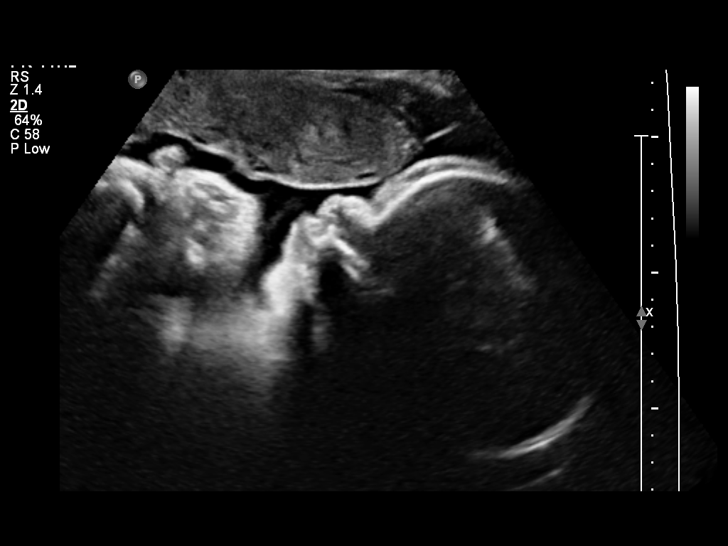

[14 of 28 positions shown; findings below may reference images not displayed]

Canned report from images found in remote index.

Refer to host system for actual result text.

## 2015-03-30 ENCOUNTER — Inpatient Hospital Stay (HOSPITAL_COMMUNITY)
Admission: EM | Admit: 2015-03-30 | Discharge: 2015-03-31 | Disposition: A | Discharge status: Home / Self Care | Payer: Medicaid Other | Source: Ambulatory Visit | Attending: Obstetrics & Gynecology | Admitting: Obstetrics & Gynecology

## 2015-03-30 DIAGNOSIS — F319 Bipolar disorder, unspecified: Secondary | ICD-10-CM | POA: Diagnosis not present

## 2015-03-30 DIAGNOSIS — Z1389 Encounter for screening for other disorder: Secondary | ICD-10-CM

## 2015-03-30 DIAGNOSIS — Z3A00 Weeks of gestation of pregnancy not specified: Secondary | ICD-10-CM | POA: Diagnosis not present

## 2015-03-30 DIAGNOSIS — O26893 Other specified pregnancy related conditions, third trimester: Secondary | ICD-10-CM | POA: Diagnosis not present

## 2015-03-30 DIAGNOSIS — O0932 Supervision of pregnancy with insufficient antenatal care, second trimester: Secondary | ICD-10-CM

## 2015-03-30 DIAGNOSIS — R109 Unspecified abdominal pain: Secondary | ICD-10-CM | POA: Diagnosis not present

## 2015-03-30 DIAGNOSIS — O99210 Obesity complicating pregnancy, unspecified trimester: Secondary | ICD-10-CM | POA: Insufficient documentation

## 2015-03-30 DIAGNOSIS — Z331 Pregnant state, incidental: Secondary | ICD-10-CM | POA: Diagnosis present

## 2015-03-30 DIAGNOSIS — O0930 Supervision of pregnancy with insufficient antenatal care, unspecified trimester: Secondary | ICD-10-CM | POA: Insufficient documentation

## 2015-03-30 DIAGNOSIS — O99212 Obesity complicating pregnancy, second trimester: Secondary | ICD-10-CM

## 2015-03-30 DIAGNOSIS — Z3A28 28 weeks gestation of pregnancy: Secondary | ICD-10-CM

## 2015-03-30 DIAGNOSIS — O0990 Supervision of high risk pregnancy, unspecified, unspecified trimester: Secondary | ICD-10-CM

## 2015-03-30 DIAGNOSIS — R1030 Lower abdominal pain, unspecified: Secondary | ICD-10-CM | POA: Diagnosis not present

## 2015-03-30 DIAGNOSIS — O99340 Other mental disorders complicating pregnancy, unspecified trimester: Secondary | ICD-10-CM | POA: Diagnosis not present

## 2015-03-30 DIAGNOSIS — Z3490 Encounter for supervision of normal pregnancy, unspecified, unspecified trimester: Secondary | ICD-10-CM

## 2015-03-30 DIAGNOSIS — O9989 Other specified diseases and conditions complicating pregnancy, childbirth and the puerperium: Secondary | ICD-10-CM | POA: Diagnosis not present

## 2015-03-31 ENCOUNTER — Encounter (HOSPITAL_COMMUNITY): Payer: Self-pay | Admitting: Emergency Medicine

## 2015-03-31 ENCOUNTER — Inpatient Hospital Stay (HOSPITAL_COMMUNITY): Payer: Medicaid Other

## 2015-03-31 DIAGNOSIS — O9989 Other specified diseases and conditions complicating pregnancy, childbirth and the puerperium: Secondary | ICD-10-CM

## 2015-03-31 DIAGNOSIS — R109 Unspecified abdominal pain: Secondary | ICD-10-CM

## 2015-03-31 LAB — DIFFERENTIAL
BASOS ABS: 0 10*3/uL (ref 0.0–0.1)
BASOS PCT: 0 % (ref 0–1)
Eosinophils Absolute: 0.1 10*3/uL (ref 0.0–0.7)
Eosinophils Relative: 1 % (ref 0–5)
LYMPHS PCT: 30 % (ref 12–46)
Lymphs Abs: 3.9 10*3/uL (ref 0.7–4.0)
MONO ABS: 0.6 10*3/uL (ref 0.1–1.0)
Monocytes Relative: 4 % (ref 3–12)
Neutro Abs: 8.2 10*3/uL — ABNORMAL HIGH (ref 1.7–7.7)
Neutrophils Relative %: 64 % (ref 43–77)

## 2015-03-31 LAB — OB RESULTS CONSOLE HIV ANTIBODY (ROUTINE TESTING): HIV: NONREACTIVE

## 2015-03-31 LAB — COMPREHENSIVE METABOLIC PANEL
ALT: 6 U/L — ABNORMAL LOW (ref 14–54)
AST: 14 U/L — ABNORMAL LOW (ref 15–41)
Albumin: 3 g/dL — ABNORMAL LOW (ref 3.5–5.0)
Alkaline Phosphatase: 109 U/L (ref 38–126)
Anion gap: 7 (ref 5–15)
BILIRUBIN TOTAL: 0.3 mg/dL (ref 0.3–1.2)
BUN: 8 mg/dL (ref 6–20)
CO2: 21 mmol/L — ABNORMAL LOW (ref 22–32)
CREATININE: 0.46 mg/dL (ref 0.44–1.00)
Calcium: 9 mg/dL (ref 8.9–10.3)
Chloride: 106 mmol/L (ref 101–111)
Glucose, Bld: 87 mg/dL (ref 65–99)
POTASSIUM: 4 mmol/L (ref 3.5–5.1)
Sodium: 134 mmol/L — ABNORMAL LOW (ref 135–145)
TOTAL PROTEIN: 7.9 g/dL (ref 6.5–8.1)

## 2015-03-31 LAB — URINALYSIS, ROUTINE W REFLEX MICROSCOPIC
Bilirubin Urine: NEGATIVE
Glucose, UA: NEGATIVE mg/dL
Hgb urine dipstick: NEGATIVE
Ketones, ur: NEGATIVE mg/dL
NITRITE: NEGATIVE
PH: 6.5 (ref 5.0–8.0)
PROTEIN: NEGATIVE mg/dL
Specific Gravity, Urine: 1.023 (ref 1.005–1.030)
Urobilinogen, UA: 1 mg/dL (ref 0.0–1.0)

## 2015-03-31 LAB — CBC
HCT: 28.6 % — ABNORMAL LOW (ref 36.0–46.0)
HEMATOCRIT: 31.9 % — AB (ref 36.0–46.0)
Hemoglobin: 8.6 g/dL — ABNORMAL LOW (ref 12.0–15.0)
Hemoglobin: 9.3 g/dL — ABNORMAL LOW (ref 12.0–15.0)
MCH: 22.1 pg — ABNORMAL LOW (ref 26.0–34.0)
MCH: 22.5 pg — ABNORMAL LOW (ref 26.0–34.0)
MCHC: 29.2 g/dL — ABNORMAL LOW (ref 30.0–36.0)
MCHC: 30.1 g/dL (ref 30.0–36.0)
MCV: 74.9 fL — AB (ref 78.0–100.0)
MCV: 76 fL — AB (ref 78.0–100.0)
PLATELETS: 337 10*3/uL (ref 150–400)
PLATELETS: 368 10*3/uL (ref 150–400)
RBC: 3.82 MIL/uL — AB (ref 3.87–5.11)
RBC: 4.2 MIL/uL (ref 3.87–5.11)
RDW: 17.2 % — ABNORMAL HIGH (ref 11.5–15.5)
RDW: 17.6 % — ABNORMAL HIGH (ref 11.5–15.5)
WBC: 12.1 10*3/uL — ABNORMAL HIGH (ref 4.0–10.5)
WBC: 12.8 10*3/uL — AB (ref 4.0–10.5)

## 2015-03-31 LAB — LIPASE, BLOOD: Lipase: 14 U/L — ABNORMAL LOW (ref 22–51)

## 2015-03-31 LAB — TYPE AND SCREEN
ABO/RH(D): O POS
Antibody Screen: NEGATIVE

## 2015-03-31 LAB — URINE MICROSCOPIC-ADD ON

## 2015-03-31 LAB — WET PREP, GENITAL
CLUE CELLS WET PREP: NONE SEEN
Trich, Wet Prep: NONE SEEN
Yeast Wet Prep HPF POC: NONE SEEN

## 2015-03-31 LAB — RAPID HIV SCREEN (HIV 1/2 AB+AG)
HIV 1/2 ANTIBODIES: NONREACTIVE
HIV-1 P24 Antigen - HIV24: NONREACTIVE

## 2015-03-31 LAB — HCG, QUANTITATIVE, PREGNANCY: hCG, Beta Chain, Quant, S: 2381 m[IU]/mL — ABNORMAL HIGH (ref <5)

## 2015-03-31 LAB — OB RESULTS CONSOLE GC/CHLAMYDIA: Gonorrhea: NEGATIVE

## 2015-03-31 LAB — HEPATITIS B SURFACE ANTIGEN: HEP B S AG: NEGATIVE

## 2015-03-31 LAB — RPR: RPR: NONREACTIVE

## 2015-03-31 MED ORDER — GI COCKTAIL ~~LOC~~
30.0000 mL | Freq: Once | ORAL | Status: AC
  Administered 2015-03-31: 30 mL via ORAL
  Filled 2015-03-31: qty 30

## 2015-03-31 MED ORDER — SODIUM CHLORIDE 0.9 % IV SOLN
Freq: Once | INTRAVENOUS | Status: AC
  Administered 2015-03-31: 04:00:00 via INTRAVENOUS

## 2015-03-31 NOTE — ED Notes (Signed)
[facility] called for transport and ETA is about an hour and a half

## 2015-03-31 NOTE — ED Provider Notes (Signed)
CSN: [id number]     Arrival date & time 03/30/15  2336 History  This chart was scribed for Earley Favor, NP, working with Dione Booze, MD by Octavia Heir, ED Scribe. This patient was seen in room WA13/WA13 and the patient's care was started at 2:14 AM.    Chief Complaint  Patient presents with  . Abdominal Pain     The history is provided by the patient. No language interpreter was used.   HPI Comments: Deborah Hall is a 19 y.o. female who presents to the Emergency Department complaining of constant, gradual worsening lower abdominal pain onset today. She reports having associated vaginal leaking of clear fluids for about 30 minutes as well. Pt says her abdominal pain feels like a cramping sensation. Pt has not had a period since January and was told she was pregnant in February. Pt reports she has not had any prenatal care.  Past Medical History  Diagnosis Date  . Obesity   . Bipolar disorder   . Suicidal ideations   . Gonorrhea 2014 pregnancy     treated   History reviewed. No pertinent past surgical history. Family History  Problem Relation Age of Onset  . Hypertension Mother   . Diabetes Father   . Cancer Neg Hx   . Heart disease Neg Hx   . Stroke Neg Hx    Social History  Substance Use Topics  . Smoking status: Never Smoker   . Smokeless tobacco: None  . Alcohol Use: No   OB History    Gravida Para Term Preterm AB TAB SAB Ectopic Multiple Living   2 1 1  0 1 0 1 0 0 1     Review of Systems  Gastrointestinal: Positive for abdominal pain.  Genitourinary: Positive for vaginal discharge. Negative for vaginal bleeding, vaginal pain and pelvic pain.  All other systems reviewed and are negative.     Allergies  Review of patient's allergies indicates no known allergies.  Home Medications   Prior to Admission medications   Medication Sig Start Date End Date Taking? Authorizing Provider  ibuprofen (ADVIL,MOTRIN) 200 MG tablet Take 400 mg by mouth every 8 (eight)  hours as needed for fever, headache, mild pain, moderate pain or cramping.   Yes Historical Provider, MD   Triage vitals: BP 133/70 mmHg  Pulse 71  Temp(Src) 98.4 F (36.9 C) (Oral)  Resp 20  Ht 5\' 4"  (1.626 m)  Wt 309 lb 3.2 oz (140.252 kg)  BMI 53.05 kg/m2  SpO2 99%  LMP  (LMP Unknown) Physical Exam  Constitutional: She is oriented to person, place, and time. She appears well-developed and well-nourished.  HENT:  Head: Normocephalic.  Eyes: EOM are normal.  Neck: Normal range of motion.  Pulmonary/Chest: Effort normal.  Abdominal: Bowel sounds are normal. She exhibits distension. There is no tenderness.  Genitourinary: Uterus is enlarged. Uterus is not tender. No tenderness or bleeding in the vagina. Vaginal discharge found.  Due to body habitus.  Cervix is unable to be visualized, but there is nothing in the vagina, i.e. prolapsed cord.  Musculoskeletal: Normal range of motion.  Neurological: She is alert and oriented to person, place, and time.  Psychiatric: She has a normal mood and affect.  Nursing note and vitals reviewed.   ED Course  Procedures  DIAGNOSTIC STUDIES: Oxygen Saturation is 99% on RA, normal by my interpretation.  COORDINATION OF CARE:  2:18 AM Discussed treatment plan which includes pelvic exam with pt at bedside and pt agreed to  plan.  Labs Review Labs Reviewed  LIPASE, BLOOD - Abnormal; Notable for the following:    Lipase 14 (*)    All other components within normal limits  COMPREHENSIVE METABOLIC PANEL - Abnormal; Notable for the following:    Sodium 134 (*)    CO2 21 (*)    Albumin 3.0 (*)    AST 14 (*)    ALT 6 (*)    All other components within normal limits  CBC - Abnormal; Notable for the following:    WBC 12.1 (*)    Hemoglobin 9.3 (*)    HCT 31.9 (*)    MCV 76.0 (*)    MCH 22.1 (*)    MCHC 29.2 (*)    RDW 17.2 (*)    All other components within normal limits  URINALYSIS, ROUTINE W REFLEX MICROSCOPIC (NOT AT [facility]) - Abnormal;  Notable for the following:    APPearance CLOUDY (*)    Leukocytes, UA MODERATE (*)    All other components within normal limits  HCG, QUANTITATIVE, PREGNANCY - Abnormal; Notable for the following:    hCG, Beta Chain, Quant, S 2381 (*)    All other components within normal limits  URINE MICROSCOPIC-ADD ON - Abnormal; Notable for the following:    Squamous Epithelial / LPF FEW (*)    Bacteria, UA MANY (*)    All other components within normal limits    Imaging Review No results found. I have personally reviewed and evaluated these images and lab results as part of my medical decision-making.   EKG Interpretation None     I performed a bedside ultrasound, which shows fetal heart movement.  I did not get measurements, but spinal cord and extremities.  Visualized Vision was monitored does not appear that she is having contractions rapid response OB nurse to the bedside.  She agrees that she is not having contractions at this time.  She was able to visualize the cervix which is closed.  The fluid that patient states she was passing is negative for amniotic fluid.  She will be transferred to [facility] for continued monitoring  MDM   Final diagnoses:  None    I personally performed the services described in this documentation, which was scribed in my presence. The recorded information has been reviewed and is accurate.  Earley Favor, NP 03/31/15 1610  Dione Booze, MD 03/31/15 [phone]

## 2015-03-31 NOTE — ED Notes (Addendum)
Pt reports she is pregnant but is unsure who far. She reports "tigthening" around her stomach and fluid leaking out of her vagina earlier today. Pt reports she was seen at hospital in [facility] ans was told she was pregnant months ago. No prenatal care. Pt is a VERY poor historian. LMP possibly in Jan or FEb. Pt is unsure. This is third pregnancy

## 2015-03-31 NOTE — ED Notes (Signed)
Report given to [facility]. 

## 2015-03-31 NOTE — Discharge Instructions (Signed)

## 2015-03-31 NOTE — MAU Provider Note (Signed)
History    CSN: [id number] Arrival date and time: 03/30/15 2336 MAU Provider at bedside 0702    Chief Complaint  Patient presents with  . Abdominal Pain   HPI Patient is 19 y.o. [id number] Unknown here with complaints of abdominal pain  Abdomina pain:  Pain started 3 days ago. located in the middle, top of abdomen. Describes a cramping that will not go away. 6 out of 10 on pain scale. Never had this before.  Worse with lying down. Not worsened with food consumption.  Worried about this pain because she has not had any prenatal.  Tried tylenol which helps with pain. Denies nausea and vomiting associated with pain. Denies use of antiacid medications or history of GERD.   Takes ibuprofen 400mg  BID. Takes about 3 times per week on average but has taken regularly for the past 3 days for the pain.   +FM, denies LOF, VB, contractions, vaginal discharge.   Possible LMP in February   OB History    Gravida Para Term Preterm AB TAB SAB Ectopic Multiple Living   3 2 2  0 0 0 0 0 1 3      Past Medical History  Diagnosis Date  . Obesity   . Bipolar disorder   . Suicidal ideations   . Gonorrhea 2014 pregnancy     treated    History reviewed. No pertinent past surgical history.  Family History  Problem Relation Age of Onset  . Hypertension Mother   . Diabetes Father   . Cancer Neg Hx   . Heart disease Neg Hx   . Stroke Neg Hx     Social History  Substance Use Topics  . Smoking status: Never Smoker   . Smokeless tobacco: None  . Alcohol Use: No    Allergies: No Known Allergies  Prescriptions prior to admission  Medication Sig Dispense Refill Last Dose  . ibuprofen (ADVIL,MOTRIN) 200 MG tablet Take 400 mg by mouth every 8 (eight) hours as needed for fever, headache, mild pain, moderate pain or cramping.   03/30/2015 at 2100    Review of Systems  Constitutional: Negative for fever and chills.  Eyes: Negative for blurred vision and double vision.  Respiratory: Negative for  cough and shortness of breath.   Cardiovascular: Negative for chest pain and orthopnea.  Gastrointestinal: Negative for nausea and vomiting.  Genitourinary: Negative for dysuria, frequency and flank pain.  Musculoskeletal: Negative for myalgias.  Skin: Negative for rash.  Neurological: Negative for dizziness, tingling, weakness and headaches.  Endo/Heme/Allergies: Does not bruise/bleed easily.  Psychiatric/Behavioral: Negative for depression and suicidal ideas. The patient is not nervous/anxious.    Physical Exam   Blood pressure 107/66, pulse 72, temperature 98.1 F (36.7 C), temperature source Oral, resp. rate 18, height 5\' 4"  (1.626 m), weight 309 lb 3.2 oz (140.252 kg), SpO2 100 %, unknown if currently breastfeeding.  Physical Exam  Nursing note and vitals reviewed. Constitutional: She is oriented to person, place, and time. She appears well-developed and well-nourished. No distress.  Pregnant female  HENT:  Head: Normocephalic and atraumatic.  Eyes: Conjunctivae are normal. No scleral icterus.  Neck: Normal range of motion. Neck supple.  Cardiovascular: Normal rate and intact distal pulses.   Respiratory: Effort normal. She exhibits no tenderness.  GI: Soft. There is tenderness. There is no rebound (TTP in the epigastric region and over stomach (LUQ, superior portion)) and no guarding.  Gravid  Genitourinary: Vagina normal.  Musculoskeletal: Normal range of motion. She exhibits no edema.  Neurological: She is alert and oriented to person, place, and time.  Skin: Skin is warm and dry. No rash noted.  Psychiatric: She has a normal mood and affect.    MAU Course  Procedures  MDM UA- negative UDS Negative Wet Prep- negative CMP- wnl, no LFT elevation Lipase wnl GC/CT- collected, pending NST- reviewed paper strip from [facility] 150/mod/+accels, no decels  Assessment and Plan  Emaly Boschert is a 19 y.o. G3P2003 at Unknown gestational age  #Abdominal Pain: suspect  gastritis from ibuprofen. Could be GERD vs PUD. No worried about hepatitis or Preeclampsia given normal BP. Pancreatitis is not on ddx given benign presentation and normal lipase.  - GI Cocktail  - Likely d/c home after reassessment by oncoming provider, report given  #Pregnancy: needs [place] for dating. Instructed to stop ibuprofen.   Isa Rankin [facility] 03/31/2015, 9:04 AM

## 2015-03-31 NOTE — Progress Notes (Addendum)
Dr Alvester Morin on unit when pt arrived. Aware of pt's status. Will see pt. OK to leave pt off EFM after reviewing paper strip brought with pt from [facility]

## 2015-03-31 NOTE — Progress Notes (Signed)
Notified pt has no f/u appts scheduled, no PNC thus far, will tell pt to call [facility] or [facility] for appt.

## 2015-03-31 NOTE — MAU Note (Signed)
Pt transferred over via [facility] from [facility]. Alert, and oriented. SMiling. No PNC at approx 7mos. Having upper abd pain

## 2015-03-31 NOTE — Progress Notes (Signed)
Notified of u/s results, orders to d/c home

## 2015-04-01 LAB — GC/CHLAMYDIA PROBE AMP (~~LOC~~) NOT AT ARMC
CHLAMYDIA, DNA PROBE: NEGATIVE
Neisseria Gonorrhea: NEGATIVE

## 2015-04-01 LAB — RUBELLA SCREEN: RUBELLA: 2.61 {index} (ref >0.99)

## 2015-04-02 DIAGNOSIS — O0990 Supervision of high risk pregnancy, unspecified, unspecified trimester: Secondary | ICD-10-CM

## 2015-04-29 LAB — OB RESULTS CONSOLE RPR: RPR: NONREACTIVE

## 2015-05-03 ENCOUNTER — Encounter (HOSPITAL_COMMUNITY): Payer: Self-pay | Admitting: *Deleted

## 2015-05-03 ENCOUNTER — Inpatient Hospital Stay (HOSPITAL_COMMUNITY)
Admission: AD | Admit: 2015-05-03 | Discharge: 2015-05-04 | Disposition: A | Discharge status: Home / Self Care | Payer: Medicaid Other | Source: Ambulatory Visit | Attending: Obstetrics and Gynecology | Admitting: Obstetrics and Gynecology

## 2015-05-03 DIAGNOSIS — R109 Unspecified abdominal pain: Secondary | ICD-10-CM | POA: Insufficient documentation

## 2015-05-03 DIAGNOSIS — O99613 Diseases of the digestive system complicating pregnancy, third trimester: Secondary | ICD-10-CM | POA: Insufficient documentation

## 2015-05-03 DIAGNOSIS — Z3A33 33 weeks gestation of pregnancy: Secondary | ICD-10-CM | POA: Diagnosis not present

## 2015-05-03 DIAGNOSIS — K219 Gastro-esophageal reflux disease without esophagitis: Secondary | ICD-10-CM | POA: Diagnosis not present

## 2015-05-03 LAB — URINE MICROSCOPIC-ADD ON

## 2015-05-03 LAB — URINALYSIS, ROUTINE W REFLEX MICROSCOPIC
Glucose, UA: NEGATIVE mg/dL
HGB URINE DIPSTICK: NEGATIVE
Ketones, ur: 15 mg/dL — AB
NITRITE: NEGATIVE
PROTEIN: NEGATIVE mg/dL
UROBILINOGEN UA: 2 mg/dL — AB (ref 0.0–1.0)
pH: 6 (ref 5.0–8.0)

## 2015-05-03 MED ORDER — OXYCODONE-ACETAMINOPHEN 5-325 MG PO TABS
2.0000 | ORAL_TABLET | Freq: Once | ORAL | Status: AC
  Administered 2015-05-04: 2 via ORAL
  Filled 2015-05-03: qty 2

## 2015-05-03 MED ORDER — GI COCKTAIL ~~LOC~~
30.0000 mL | Freq: Three times a day (TID) | ORAL | Status: DC | PRN
  Administered 2015-05-04: 30 mL via ORAL
  Filled 2015-05-03: qty 30

## 2015-05-03 NOTE — MAU Note (Addendum)
I think i'm having contractions but not sure. Sitting on couch and started having pain in upper abdomen that goes around to my back. Tightening pain. Denies bleeding or LOF. At pizza at 1900. Slight nausea but no diarrhea

## 2015-05-03 NOTE — MAU Note (Signed)
Pt. [facility] Last visit was Monday. Next appointment is the 20th. Denies LOF or bleeding. Pt. States baby is moving well but slowed down today. Here for upper abdominal pain and upper back pain that began this evening. Abdomen is tender to touch.

## 2015-05-04 DIAGNOSIS — K219 Gastro-esophageal reflux disease without esophagitis: Secondary | ICD-10-CM

## 2015-05-04 MED ORDER — PANTOPRAZOLE SODIUM 20 MG PO TBEC: 20.0000 mg | DELAYED_RELEASE_TABLET | Freq: Every day | ORAL | Status: DC

## 2015-05-04 NOTE — Discharge Instructions (Signed)

## 2015-05-04 NOTE — MAU Provider Note (Signed)
  History     CSN: [id number]  Arrival date and time: 05/03/15 2221   None     Chief Complaint  Patient presents with  . Contractions   HPI Ms Antony MaduraRutledge is a 19yo [id number] @ 33.2wks who presents for eval of sudden onset mid upper abd pain and all-over back pain this evening. Denies leaking or bldg. Slight N, no V/D, fever, dysuria  OB History    Gravida Para Term Preterm AB TAB SAB Ectopic Multiple Living   3 2 2  0 0 0 0 0 1 3      Past Medical History  Diagnosis Date  . Obesity   . Bipolar disorder (HCC)   . Suicidal ideations   . Gonorrhea 2014 pregnancy     treated    History reviewed. No pertinent past surgical history.  Family History  Problem Relation Age of Onset  . Hypertension Mother   . Diabetes Father   . Cancer Neg Hx   . Heart disease Neg Hx   . Stroke Neg Hx     Social History  Substance Use Topics  . Smoking status: Never Smoker   . Smokeless tobacco: None  . Alcohol Use: No    Allergies: No Known Allergies  Prescriptions prior to admission  Medication Sig Dispense Refill Last Dose  . ferrous fumarate (HEMOCYTE - 106 MG FE) 325 (106 FE) MG TABS tablet Take 1 tablet by mouth.   05/03/2015 at Unknown time    ROS Physical Exam   Blood pressure 155/74, pulse 80, temperature 97.8 F (36.6 C), resp. rate 20, height 5\' 5"  (1.651 m), weight 144.607 kg (318 lb 12.8 oz), unknown if currently breastfeeding.  Physical Exam  Constitutional: She is oriented to person, place, and time. She appears well-developed.  HENT:  Head: Normocephalic.  Neck: Normal range of motion.  Cardiovascular: Normal rate.   Respiratory: Effort normal.  GI:  EFM 150s, +accels, no decels, occ mi variables No ctx per toco   Genitourinary:  Cx FT/thick/high  Musculoskeletal: Normal range of motion.  Neg CVAT  Neurological: She is alert and oriented to person, place, and time.  Skin: Skin is warm and dry.  Psychiatric: She has a normal mood and affect. Her behavior  is normal. Thought content normal.   Urinalysis    Component Value Date/Time   COLORURINE YELLOW 05/03/2015 2240   APPEARANCEUR CLEAR 05/03/2015 2240   LABSPEC >1.030* 05/03/2015 2240   PHURINE 6.0 05/03/2015 2240   GLUCOSEU NEGATIVE 05/03/2015 2240   HGBUR NEGATIVE 05/03/2015 2240   BILIRUBINUR SMALL* 05/03/2015 2240   KETONESUR 15* 05/03/2015 2240   PROTEINUR NEGATIVE 05/03/2015 2240   UROBILINOGEN 2.0* 05/03/2015 2240   NITRITE NEGATIVE 05/03/2015 2240   LEUKOCYTESUR TRACE* 05/03/2015 2240   Micro: few bacteria, otherwise neg   MAU Course  Procedures  MDM NST read UA ordered Cx examined  Given GI cocktail and Percocet x 2 in MAU- pain mostly resolved  Assessment and Plan  IUP@33 .2wks GERD  D/C home with instructions re GERD Rx Protonix Increase water intake daily F/U as scheduled for fetal echo this coming week and then next visit at HD on the 20th  SHAW, KIMBERLY CNM 05/04/2015, 12:19 AM

## 2015-05-29 LAB — OB RESULTS CONSOLE GBS: STREP GROUP B AG: NEGATIVE

## 2015-06-08 ENCOUNTER — Inpatient Hospital Stay (HOSPITAL_COMMUNITY)
Admission: AD | Admit: 2015-06-08 | Discharge: 2015-06-08 | Disposition: A | Discharge status: Home / Self Care | Payer: Medicaid Other | Source: Ambulatory Visit | Attending: Obstetrics and Gynecology | Admitting: Obstetrics and Gynecology

## 2015-06-08 ENCOUNTER — Encounter (HOSPITAL_COMMUNITY): Payer: Self-pay | Admitting: *Deleted

## 2015-06-08 DIAGNOSIS — Z3493 Encounter for supervision of normal pregnancy, unspecified, third trimester: Secondary | ICD-10-CM | POA: Insufficient documentation

## 2015-06-08 NOTE — MAU Note (Signed)
Patient presents at 38 weeks with c/o irregular contractions X 4 hours. Fetus active. Denies bleeding or discharge.

## 2015-06-08 NOTE — Discharge Instructions (Signed)
Braxton Hicks Contractions °Contractions of the uterus can occur throughout pregnancy. Contractions are not always a sign that you are in labor.  °WHAT ARE BRAXTON HICKS CONTRACTIONS?  °Contractions that occur before labor are called Braxton Hicks contractions, or false labor. Toward the end of pregnancy (32-34 weeks), these contractions can develop more often and may become more forceful. This is not true labor because these contractions do not result in opening (dilatation) and thinning of the cervix. They are sometimes difficult to tell apart from true labor because these contractions can be forceful and people have different pain tolerances. You should not feel embarrassed if you go to the hospital with false labor. Sometimes, the only way to tell if you are in true labor is for your health care provider to look for changes in the cervix. °If there are no prenatal problems or other health problems associated with the pregnancy, it is completely safe to be sent home with false labor and await the onset of true labor. °HOW CAN YOU TELL THE DIFFERENCE BETWEEN TRUE AND FALSE LABOR? °False Labor °· The contractions of false labor are usually shorter and not as hard as those of true labor.   °· The contractions are usually irregular.   °· The contractions are often felt in the front of the lower abdomen and in the groin.   °· The contractions may go away when you walk around or change positions while lying down.   °· The contractions get weaker and are shorter lasting as time goes on.   °· The contractions do not usually become progressively stronger, regular, and closer together as with true labor.   °True Labor °· Contractions in true labor last 30-70 seconds, become very regular, usually become more intense, and increase in frequency.   °· The contractions do not go away with walking.   °· The discomfort is usually felt in the top of the uterus and spreads to the lower abdomen and low back.   °· True labor can be  determined by your health care provider with an exam. This will show that the cervix is dilating and getting thinner.   °WHAT TO REMEMBER °· Keep up with your usual exercises and follow other instructions given by your health care provider.   °· Take medicines as directed by your health care provider.   °· Keep your regular prenatal appointments.   °· Eat and drink lightly if you think you are going into labor.   °· If Braxton Hicks contractions are making you uncomfortable:   °¨ Change your position from lying down or resting to walking, or from walking to resting.   °¨ Sit and rest in a tub of warm water.   °¨ Drink 2-3 glasses of water. Dehydration may cause these contractions.   °¨ Do slow and deep breathing several times an hour.   °WHEN SHOULD I SEEK IMMEDIATE MEDICAL CARE? °Seek immediate medical care if: °· Your contractions become stronger, more regular, and closer together.   °· You have fluid leaking or gushing from your vagina.   °· You have a fever.   °· You pass blood-tinged mucus.   °· You have vaginal bleeding.   °· You have continuous abdominal pain.   °· You have low back pain that you never had before.   °· You feel your baby's head pushing down and causing pelvic pressure.   °· Your baby is not moving as much as it used to.   °  °This information is not intended to replace advice given to you by your health care provider. Make sure you discuss any questions you have with your health care   provider.   Document Released: 07/06/2005 Document Revised: 07/11/2013 Document Reviewed: 04/17/2013 Elsevier Interactive Patient Education [employer] Inc[employer] Third Trimester of Pregnancy The third trimester is from week 29 through week 42, months 7 through 9. This trimester is when your unborn baby (fetus) is growing very fast. At the end of the ninth month, the unborn baby is about 20 inches in length. It weighs about 6-10 pounds.  HOME CARE   Avoid all smoking, herbs, and alcohol. Avoid drugs not  approved by your doctor.  Do not use any tobacco products, including cigarettes, chewing tobacco, and electronic cigarettes. If you need help quitting, ask your doctor. You may get counseling or other support to help you quit.  Only take medicine as told by your doctor. Some medicines are safe and some are not during pregnancy.  Exercise only as told by your doctor. Stop exercising if you start having cramps.  Eat regular, healthy meals.  Wear a good support bra if your breasts are tender.  Do not use hot tubs, steam rooms, or saunas.  Wear your seat belt when driving.  Avoid raw meat, uncooked cheese, and liter boxes and soil used by cats.  Take your prenatal vitamins.  Take 1500-2000 milligrams of calcium daily starting at the 20th week of pregnancy until you deliver your baby.  Try taking medicine that helps you poop (stool softener) as needed, and if your doctor approves. Eat more fiber by eating fresh fruit, vegetables, and whole grains. Drink enough fluids to keep your pee (urine) clear or pale yellow.  Take warm water baths (sitz baths) to soothe pain or discomfort caused by hemorrhoids. Use hemorrhoid cream if your doctor approves.  If you have puffy, bulging veins (varicose veins), wear support hose. Raise (elevate) your feet for 15 minutes, 3-4 times a day. Limit salt in your diet.  Avoid heavy lifting, wear low heels, and sit up straight.  Rest with your legs raised if you have leg cramps or low back pain.  Visit your dentist if you have not gone during your pregnancy. Use a soft toothbrush to brush your teeth. Be gentle when you floss.  You can have sex (intercourse) unless your doctor tells you not to.  Do not travel far distances unless you must. Only do so with your doctor's approval.  Take prenatal classes.  Practice driving to the hospital.  Pack your hospital bag.  Prepare the baby's room.  Go to your doctor visits. GET HELP IF:  You are not sure if  you are in labor or if your water has broken.  You are dizzy.  You have mild cramps or pressure in your lower belly (abdominal).  You have a nagging pain in your belly area.  You continue to feel sick to your stomach (nauseous), throw up (vomit), or have watery poop (diarrhea).  You have bad smelling fluid coming from your vagina.  You have pain with peeing (urination). GET HELP RIGHT AWAY IF:   You have a fever.  You are leaking fluid from your vagina.  You are spotting or bleeding from your vagina.  You have severe belly cramping or pain.  You lose or gain weight rapidly.  You have trouble catching your breath and have chest pain.  You notice sudden or extreme puffiness (swelling) of your face, hands, ankles, feet, or legs.  You have not felt the baby move in over an hour.  You have severe headaches that do not go away with medicine.  You  have vision changes.   This information is not intended to replace advice given to you by your health care provider. Make sure you discuss any questions you have with your health care provider.   Document Released: 09/30/2009 Document Revised: 07/27/2014 Document Reviewed: 09/06/2012 Elsevier Interactive Patient Education 2016 Elsevier Inc. Fetal Movement Counts Patient Name: __________________________________________________ Patient Due Date: ____________________ Performing a fetal movement count is highly recommended in high-risk pregnancies, but it is good for every pregnant woman to do. Your health care provider may ask you to start counting fetal movements at 28 weeks of the pregnancy. Fetal movements often increase:  After eating a full meal.  After physical activity.  After eating or drinking something sweet or cold.  At rest. Pay attention to when you feel the baby is most active. This will help you notice a pattern of your baby's sleep and wake cycles and what factors contribute to an increase in fetal movement. It is  important to perform a fetal movement count at the same time each day when your baby is normally most active.  HOW TO COUNT FETAL MOVEMENTS  Find a quiet and comfortable area to sit or lie down on your left side. Lying on your left side provides the best blood and oxygen circulation to your baby.  Write down the day and time on a sheet of paper or in a journal.  Start counting kicks, flutters, swishes, rolls, or jabs in a 2-hour period. You should feel at least 10 movements within 2 hours.  If you do not feel 10 movements in 2 hours, wait 2-3 hours and count again. Look for a change in the pattern or not enough counts in 2 hours. SEEK MEDICAL CARE IF:  You feel less than 10 counts in 2 hours, tried twice.  There is no movement in over an hour.  The pattern is changing or taking longer each day to reach 10 counts in 2 hours.  You feel the baby is not moving as he or she usually does. Date: ____________ Movements: ____________ Start time: ____________ Doreatha Martin time: ____________  Date: ____________ Movements: ____________ Start time: ____________ Doreatha Martin time: ____________ Date: ____________ Movements: ____________ Start time: ____________ Doreatha Martin time: ____________ Date: ____________ Movements: ____________ Start time: ____________ Doreatha Martin time: ____________ Date: ____________ Movements: ____________ Start time: ____________ Doreatha Martin time: ____________ Date: ____________ Movements: ____________ Start time: ____________ Doreatha Martin time: ____________ Date: ____________ Movements: ____________ Start time: ____________ Doreatha Martin time: ____________ Date: ____________ Movements: ____________ Start time: ____________ Doreatha Martin time: ____________  Date: ____________ Movements: ____________ Start time: ____________ Doreatha Martin time: ____________ Date: ____________ Movements: ____________ Start time: ____________ Doreatha Martin time: ____________ Date: ____________ Movements: ____________ Start time: ____________ Doreatha Martin time:  ____________ Date: ____________ Movements: ____________ Start time: ____________ Doreatha Martin time: ____________ Date: ____________ Movements: ____________ Start time: ____________ Doreatha Martin time: ____________ Date: ____________ Movements: ____________ Start time: ____________ Doreatha Martin time: ____________ Date: ____________ Movements: ____________ Start time: ____________ Doreatha Martin time: ____________  Date: ____________ Movements: ____________ Start time: ____________ Doreatha Martin time: ____________ Date: ____________ Movements: ____________ Start time: ____________ Doreatha Martin time: ____________ Date: ____________ Movements: ____________ Start time: ____________ Doreatha Martin time: ____________ Date: ____________ Movements: ____________ Start time: ____________ Doreatha Martin time: ____________ Date: ____________ Movements: ____________ Start time: ____________ Doreatha Martin time: ____________ Date: ____________ Movements: ____________ Start time: ____________ Doreatha Martin time: ____________ Date: ____________ Movements: ____________ Start time: ____________ Doreatha Martin time: ____________  Date: ____________ Movements: ____________ Start time: ____________ Doreatha Martin time: ____________ Date: ____________ Movements: ____________ Start time: ____________ Doreatha Martin time: ____________ Date: ____________ Movements: ____________ Start time: ____________  Finish time: ____________ Date: ____________ Movements: ____________ Start time: ____________ Elizebeth Koller time: ____________ Date: ____________ Movements: ____________ Start time: ____________ Elizebeth Koller time: ____________ Date: ____________ Movements: ____________ Start time: ____________ Elizebeth Koller time: ____________ Date: ____________ Movements: ____________ Start time: ____________ Elizebeth Koller time: ____________  Date: ____________ Movements: ____________ Start time: ____________ Elizebeth Koller time: ____________ Date: ____________ Movements: ____________ Start time: ____________ Elizebeth Koller time: ____________ Date: ____________ Movements:  ____________ Start time: ____________ Elizebeth Koller time: ____________ Date: ____________ Movements: ____________ Start time: ____________ Elizebeth Koller time: ____________ Date: ____________ Movements: ____________ Start time: ____________ Elizebeth Koller time: ____________ Date: ____________ Movements: ____________ Start time: ____________ Elizebeth Koller time: ____________ Date: ____________ Movements: ____________ Start time: ____________ Elizebeth Koller time: ____________  Date: ____________ Movements: ____________ Start time: ____________ Elizebeth Koller time: ____________ Date: ____________ Movements: ____________ Start time: ____________ Elizebeth Koller time: ____________ Date: ____________ Movements: ____________ Start time: ____________ Elizebeth Koller time: ____________ Date: ____________ Movements: ____________ Start time: ____________ Elizebeth Koller time: ____________ Date: ____________ Movements: ____________ Start time: ____________ Elizebeth Koller time: ____________ Date: ____________ Movements: ____________ Start time: ____________ Elizebeth Koller time: ____________ Date: ____________ Movements: ____________ Start time: ____________ Elizebeth Koller time: ____________  Date: ____________ Movements: ____________ Start time: ____________ Elizebeth Koller time: ____________ Date: ____________ Movements: ____________ Start time: ____________ Elizebeth Koller time: ____________ Date: ____________ Movements: ____________ Start time: ____________ Elizebeth Koller time: ____________ Date: ____________ Movements: ____________ Start time: ____________ Elizebeth Koller time: ____________ Date: ____________ Movements: ____________ Start time: ____________ Elizebeth Koller time: ____________ Date: ____________ Movements: ____________ Start time: ____________ Elizebeth Koller time: ____________ Date: ____________ Movements: ____________ Start time: ____________ Elizebeth Koller time: ____________  Date: ____________ Movements: ____________ Start time: ____________ Elizebeth Koller time: ____________ Date: ____________ Movements: ____________ Start time: ____________ Elizebeth Koller  time: ____________ Date: ____________ Movements: ____________ Start time: ____________ Elizebeth Koller time: ____________ Date: ____________ Movements: ____________ Start time: ____________ Elizebeth Koller time: ____________ Date: ____________ Movements: ____________ Start time: ____________ Elizebeth Koller time: ____________ Date: ____________ Movements: ____________ Start time: ____________ Elizebeth Koller time: ____________   This information is not intended to replace advice given to you by your health care provider. Make sure you discuss any questions you have with your health care provider.   Document Released: 08/05/2006 Document Revised: 07/27/2014 Document Reviewed: 05/02/2012 Elsevier Interactive Patient Education [employer].

## 2015-06-18 ENCOUNTER — Encounter (HOSPITAL_COMMUNITY): Payer: Self-pay | Admitting: *Deleted

## 2015-06-18 ENCOUNTER — Inpatient Hospital Stay (HOSPITAL_COMMUNITY)
Admission: AD | Admit: 2015-06-18 | Discharge: 2015-06-18 | Disposition: A | Discharge status: Home / Self Care | Payer: Medicaid Other | Source: Ambulatory Visit | Attending: Obstetrics & Gynecology | Admitting: Obstetrics & Gynecology

## 2015-06-18 DIAGNOSIS — M62830 Muscle spasm of back: Secondary | ICD-10-CM | POA: Diagnosis not present

## 2015-06-18 DIAGNOSIS — M549 Dorsalgia, unspecified: Secondary | ICD-10-CM

## 2015-06-18 DIAGNOSIS — O99613 Diseases of the digestive system complicating pregnancy, third trimester: Secondary | ICD-10-CM | POA: Diagnosis not present

## 2015-06-18 DIAGNOSIS — Z3A39 39 weeks gestation of pregnancy: Secondary | ICD-10-CM | POA: Diagnosis not present

## 2015-06-18 DIAGNOSIS — M791 Myalgia, unspecified site: Secondary | ICD-10-CM

## 2015-06-18 DIAGNOSIS — O26899 Other specified pregnancy related conditions, unspecified trimester: Secondary | ICD-10-CM | POA: Diagnosis not present

## 2015-06-18 DIAGNOSIS — R1013 Epigastric pain: Secondary | ICD-10-CM | POA: Diagnosis present

## 2015-06-18 DIAGNOSIS — O0993 Supervision of high risk pregnancy, unspecified, third trimester: Secondary | ICD-10-CM

## 2015-06-18 DIAGNOSIS — O9989 Other specified diseases and conditions complicating pregnancy, childbirth and the puerperium: Secondary | ICD-10-CM

## 2015-06-18 DIAGNOSIS — K219 Gastro-esophageal reflux disease without esophagitis: Secondary | ICD-10-CM | POA: Insufficient documentation

## 2015-06-18 DIAGNOSIS — Z87891 Personal history of nicotine dependence: Secondary | ICD-10-CM | POA: Insufficient documentation

## 2015-06-18 HISTORY — DX: Anemia, unspecified: D64.9

## 2015-06-18 MED ORDER — GI COCKTAIL ~~LOC~~
30.0000 mL | Freq: Once | ORAL | Status: DC
  Filled 2015-06-18: qty 30

## 2015-06-18 MED ORDER — CYCLOBENZAPRINE HCL 5 MG PO TABS
7.5000 mg | ORAL_TABLET | Freq: Once | ORAL | Status: AC
  Administered 2015-06-18: 7.5 mg via ORAL
  Filled 2015-06-18: qty 2

## 2015-06-18 NOTE — MAU Note (Signed)
Pt experiencing all over back pain today that has since calmed down.  She is also feeling upper adominal tightness and intermittent pain.  Both of these pains she has experienced before around 37 wks and nothing was done for the pain at that time.  Reports good fetal movement and denies any vag bleeding or leaking.

## 2015-06-18 NOTE — Discharge Instructions (Signed)
Left Prior to Formal Discharge

## 2015-06-18 NOTE — MAU Provider Note (Signed)
None     Chief Complaint:  No chief complaint on file.   Deborah RobertsKayla Hall is  19 y.o. G3P2003 at [id number] presents complaining of irregular, every 15 minutes contractions that have resolved earlier today. are associated with none vaginal bleeding, intact membranes, along with active fetal movement.  Additionally, pt. Complaining of epigastric pain. She has had this pain earlier in her pregnancy and was diagnosed with acid reflux. She was prescribed protonix which helped, but she has since stopped taking her protonix. She says that she does not have any sharp abdominal pain, no contractions at this time. No dysuria, hematuria, mild nausea, no vomiting, no fever, no chills, no RUQ pain. No LE swelling, no headache, or vision changes. She additionally, has back pain from her thoracic spine to her lumbar spine. She has also had this earlier in her pregnancy, but she says it is worse today. Tried tylenol with little relief.   Pregnancy otherwise complicated by ibuprofen use in the third trimester > fetal echo ordered without evidence of cardiac compromise, but unable to see pulmonary / aortic arches. Late prenatal care. [facility].   Obstetrical/Gynecological History: OB History    Gravida Para Term Preterm AB TAB SAB Ectopic Multiple Living   3 2 2  0 0 0 0 0 1 3     Past Medical History: Past Medical History  Diagnosis Date  . Obesity   . Bipolar disorder (HCC)   . Suicidal ideations   . Gonorrhea 2014 pregnancy     treated  . Anemia     Past Surgical History: Past Surgical History  Procedure Laterality Date  . No past surgeries      Family History: Family History  Problem Relation Age of Onset  . Hypertension Mother   . Diabetes Father   . Cancer Neg Hx   . Heart disease Neg Hx   . Stroke Neg Hx     Social History: Social History  Substance Use Topics  . Smoking status: Former Smoker    Quit date: 06/17/2012  . Smokeless tobacco: None  . Alcohol Use: No     Allergies: No Known Allergies  Meds:  Prescriptions prior to admission  Medication Sig Dispense Refill Last Dose  . acetaminophen (TYLENOL) 325 MG tablet Take 650 mg by mouth every 6 (six) hours as needed for mild pain or headache.   06/18/2015 at Unknown time  . ferrous sulfate 325 (65 FE) MG tablet Take 325 mg by mouth daily.   06/18/2015 at Unknown time  . Prenatal Vit-Fe Fumarate-FA (PRENATAL MULTIVITAMIN) TABS tablet Take 1 tablet by mouth daily.   06/18/2015 at Unknown time  . pantoprazole (PROTONIX) 20 MG tablet Take 1 tablet (20 mg total) by mouth daily. (Patient not taking: Reported on 06/08/2015) 60 tablet 1 Not Taking at Unknown time    Review of Systems -   Review of Systems  Constitutional: Negative for fever, chills, weight loss, malaise/fatigue and diaphoresis.  HENT: Negative for hearing loss, ear pain, nosebleeds, congestion, sore throat, neck pain, tinnitus and ear discharge.   Eyes: Negative for blurred vision, double vision, photophobia, pain, discharge and redness.  Respiratory: Negative for cough, hemoptysis, sputum production, shortness of breath, wheezing and stridor.   Cardiovascular: Negative for chest pain, palpitations, orthopnea,  leg swelling  Gastrointestinal: Negative for abdominal pain heartburn, nausea, vomiting, diarrhea, constipation, blood in stool Genitourinary: Negative for dysuria, urgency, frequency, hematuria and flank pain.  Musculoskeletal: Negative for myalgias, back pain, joint pain  and falls.  Skin: Negative for itching and rash.  Neurological: Negative for dizziness, tingling, tremors, sensory change, speech change, focal weakness, seizures, loss of consciousness, weakness and headaches.  Endo/Heme/Allergies: Negative for environmental allergies and polydipsia. Does not bruise/bleed easily.  Psychiatric/Behavioral: Negative for depression, suicidal ideas, hallucinations, memory loss and substance abuse. The patient is not nervous/anxious  and does not have insomnia.      Physical Exam  Blood pressure 117/64, pulse 83, temperature 98.3 F (36.8 C), temperature source Oral, height  (1.651 m), weight 144.244 kg (318 lb), unknown if currently breastfeeding. GENERAL: Well-developed, well-nourished female in no acute distress.  LUNGS: Clear to auscultation bilaterally.  HEART: Regular rate and rhythm. ABDOMEN: Soft, nontender, nondistended, gravid.  EXTREMITIES: Nontender, no edema, 2+ distal pulses. CERVICAL EXAM: deferred.  FHT:  Baseline rate 135 bpm   Variability moderate  Accelerations present   Decelerations none Contractions: none.    Labs: No results found for this or any previous visit (from the past 24 hour(s)). Imaging Studies:  No results found.  Assessment: Deborah Hall is  19 y.o. G3P2003 at [redacted]w[redacted]d presents with GERD, and Back spasm.  Plan: - GI cocktail for GERD - Continue protonix at home.  - U/A - Flexeril for back spasm.   - Pt. Was given flexeril and GI cocktail. She then subsequently left without formal discharge. Plan was to d/c home.  - Unable to review return precautions as a result of her leaving before being discharged.    Caleb Melancon 11/29/201610:12 PM  OB fellow attestation: I agree with above documentation in the resident's note and we reviewed the plan. Patient left before being seen by me and the resident after flexeril. She left without discharge papers and technically AMA.   Federico Flake, MD 11:41 PM

## 2015-06-18 NOTE — MAU Note (Signed)
Urine sent to lab 

## 2015-06-20 ENCOUNTER — Inpatient Hospital Stay (HOSPITAL_COMMUNITY)
Admission: AD | Admit: 2015-06-20 | Discharge: 2015-06-20 | Disposition: A | Discharge status: Home / Self Care | Payer: Medicaid Other | Source: Ambulatory Visit | Attending: Family Medicine | Admitting: Family Medicine

## 2015-06-20 ENCOUNTER — Encounter (HOSPITAL_COMMUNITY): Payer: Self-pay

## 2015-06-20 DIAGNOSIS — O26893 Other specified pregnancy related conditions, third trimester: Secondary | ICD-10-CM | POA: Diagnosis not present

## 2015-06-20 DIAGNOSIS — O9989 Other specified diseases and conditions complicating pregnancy, childbirth and the puerperium: Secondary | ICD-10-CM | POA: Diagnosis not present

## 2015-06-20 DIAGNOSIS — L299 Pruritus, unspecified: Secondary | ICD-10-CM

## 2015-06-20 DIAGNOSIS — Z87891 Personal history of nicotine dependence: Secondary | ICD-10-CM | POA: Diagnosis not present

## 2015-06-20 DIAGNOSIS — Z3A40 40 weeks gestation of pregnancy: Secondary | ICD-10-CM | POA: Insufficient documentation

## 2015-06-20 LAB — URINALYSIS, ROUTINE W REFLEX MICROSCOPIC
BILIRUBIN URINE: NEGATIVE
Glucose, UA: NEGATIVE mg/dL
HGB URINE DIPSTICK: NEGATIVE
KETONES UR: 15 mg/dL — AB
Leukocytes, UA: NEGATIVE
Nitrite: NEGATIVE
PH: 6 (ref 5.0–8.0)
Protein, ur: NEGATIVE mg/dL
SPECIFIC GRAVITY, URINE: 1.02 (ref 1.005–1.030)

## 2015-06-20 LAB — COMPREHENSIVE METABOLIC PANEL
ALT: 18 U/L (ref 14–54)
ANION GAP: 9 (ref 5–15)
AST: 34 U/L (ref 15–41)
Albumin: 2.5 g/dL — ABNORMAL LOW (ref 3.5–5.0)
Alkaline Phosphatase: 247 U/L — ABNORMAL HIGH (ref 38–126)
BUN: 10 mg/dL (ref 6–20)
CHLORIDE: 107 mmol/L (ref 101–111)
CO2: 19 mmol/L — ABNORMAL LOW (ref 22–32)
CREATININE: 0.52 mg/dL (ref 0.44–1.00)
Calcium: 8.5 mg/dL — ABNORMAL LOW (ref 8.9–10.3)
Glucose, Bld: 113 mg/dL — ABNORMAL HIGH (ref 65–99)
POTASSIUM: 3.7 mmol/L (ref 3.5–5.1)
SODIUM: 135 mmol/L (ref 135–145)
Total Bilirubin: 0.3 mg/dL (ref 0.3–1.2)
Total Protein: 7.2 g/dL (ref 6.5–8.1)

## 2015-06-20 LAB — CBC
HCT: 31 % — ABNORMAL LOW (ref 36.0–46.0)
Hemoglobin: 9.1 g/dL — ABNORMAL LOW (ref 12.0–15.0)
MCH: 21.8 pg — AB (ref 26.0–34.0)
MCHC: 29.4 g/dL — AB (ref 30.0–36.0)
MCV: 74.3 fL — AB (ref 78.0–100.0)
PLATELETS: 394 10*3/uL (ref 150–400)
RBC: 4.17 MIL/uL (ref 3.87–5.11)
RDW: 18.1 % — AB (ref 11.5–15.5)
WBC: 11.7 10*3/uL — ABNORMAL HIGH (ref 4.0–10.5)

## 2015-06-20 MED ORDER — HYDROXYZINE HCL 25 MG PO TABS
25.0000 mg | ORAL_TABLET | Freq: Once | ORAL | Status: AC
  Administered 2015-06-20: 25 mg via ORAL
  Filled 2015-06-20: qty 1

## 2015-06-20 MED ORDER — HYDROXYZINE PAMOATE 100 MG PO CAPS: 100.0000 mg | ORAL_CAPSULE | Freq: Three times a day (TID) | ORAL | Status: DC | PRN

## 2015-06-20 NOTE — Discharge Instructions (Signed)
Cholestasis of Pregnancy °Cholestasis refers to any condition that causes the flow of the digestive fluid (bile) produced by your liver to slow or stop. Cholestasis of pregnancy is most common toward the end of pregnancy (third trimester), but it can occur any time during your pregnancy. The condition often goes away soon after your baby is born.  °Cholestasis may be uncomfortable but is usually harmless to you. However, it can be harmful to your baby. Cholestasis may increase the risk that your baby will be born too early (preterm delivery).  °CAUSES  °The cause of cholestasis of pregnancy is not known. Pregnancy hormones may affect the way your gallbladder functions. Your gallbladder normally holds the bile from your liver until you need it to help digest fat in your diet. Pregnancy hormones may cause the flow of bile to slow down and back up into your liver. Bile may then get into your bloodstream and cause cholestasis symptoms. °RISK FACTORS °You may be at increased risk if: °· You had cholestasis during a previous pregnancy. °· You have a family history of cholestasis. °· You have liver problems. °· You are having twins. °SIGNS AND SYMPTOMS  °The most common symptom of cholestasis of pregnancy is intense itching, especially on the palms of your hands and soles of your feet. The itching can spread to the rest of your body and is often worse at night. You will not usually have a rash. Other symptoms may include:  °· Feeling tired.   °· Yellowish discoloration of your skin and the whites of your eyes (jaundice).   °· Dark-colored urine.   °· Light-colored stools. °· Poor appetite.    °DIAGNOSIS  °Your health care provider will take your medical history and do a physical exam. You may have blood tests to check your liver function, bile level, and bilirubin level.  °TREATMENT  °Treatment is meant to make you more comfortable and keep your baby safe. Your health care provider may prescribe medicine to relieve your  itching. The medicine used may also improve your blood test results and help keep your baby safe. Your health care provider may also give you vitamin K before delivery to prevent excessive bleeding.  °Your health care provider may want to check your baby (fetal monitoring) frequently, as often as every 2 weeks. Once your baby's lungs have developed enough, your health care provider may recommend starting (inducing) your labor and delivery by week 37 of your pregnancy. °HOME CARE INSTRUCTIONS  °· Only use anti-itch creams and take medicines as directed by your health care provider. °· Take cool baths to soothe your itching.   °· Keep your fingernails short to prevent skin irritation from scratching.   °· Keep all your appointments for fetal monitoring. °SEEK MEDICAL CARE IF:  °Your symptoms get worse, even with treatment. °SEEK IMMEDIATE MEDICAL CARE IF: °You go into early labor at home. °MAKE SURE YOU: °· Understand these instructions. °· Will watch your condition. °· Will get help right away if you are not doing well or get worse. °  °This information is not intended to replace advice given to you by your health care provider. Make sure you discuss any questions you have with your health care provider. °  °Document Released: 07/03/2000 Document Revised: 07/27/2014 Document Reviewed: 04/28/2013 °Elsevier Interactive Patient Education ©2016 Elsevier Inc. ° ° ° °

## 2015-06-20 NOTE — MAU Provider Note (Signed)
History     CSN: [id number]  Arrival date and time: 06/20/15 1700   First Provider Initiated Contact with Patient 06/20/15 1731      Chief Complaint  Patient presents with  . Pruritis   HPI  [name] is a 19 y.o. G3P2003 at [redacted]w[redacted]d who presents to MAU today with complaint of diffuse itching since Tuesday. The patient denies rash or insect bites. She has not taken anything for the itching prior to arrival. She states that it keeps her from being able to sleep. She denies anyone else at home with similar symptoms. She denies vaginal bleeding, discharge, LOF or contractions. She reports good fetal movement.   OB History    Gravida Para Term Preterm AB TAB SAB Ectopic Multiple Living   0 0 0 0 0 1 3      Past Medical History  Diagnosis Date  . Obesity   . Bipolar disorder (HCC)   . Suicidal ideations   . Gonorrhea 2014 pregnancy     treated  . Anemia     Past Surgical History  Procedure Laterality Date  . No past surgeries      Family History  Problem Relation Age of Onset  . Hypertension Mother   . Diabetes Father   . Cancer Neg Hx   . Heart disease Neg Hx   . Stroke Neg Hx     Social History  Substance Use Topics  . Smoking status: Former Smoker    Quit date: 06/17/2012  . Smokeless tobacco: Never Used  . Alcohol Use: No    Allergies: No Known Allergies  No prescriptions prior to admission    Review of Systems  Constitutional: Negative for fever and malaise/fatigue.  Gastrointestinal: Negative for abdominal pain.  Genitourinary:       Neg - vaginal bleeding, discharge, LOF  Skin: Positive for itching. Negative for rash.   Physical Exam   Blood pressure 123/64, pulse 81, temperature 98.1 F (36.7 C), temperature source Oral, resp. rate 18, height  (1.651 m), weight 314 lb (142.429 kg), unknown if currently breastfeeding.  Physical Exam  Nursing note and vitals reviewed. Constitutional: She is oriented to person, place, and  time. She appears well-developed and well-nourished. No distress.  HENT:  Head: Normocephalic and atraumatic.  Cardiovascular: Normal rate.   Respiratory: Effort normal.  GI: Soft. She exhibits no distension and no mass. There is no tenderness. There is no rebound and no guarding.  Neurological: She is alert and oriented to person, place, and time.  Skin: Skin is warm and dry. No erythema.  Psychiatric: She has a normal mood and affect.    Results for orders placed or performed during the hospital encounter of 06/20/15 (from the past 24 hour(s))  Urinalysis, Routine w reflex microscopic (not at [facility])     Status: Abnormal   Collection Time: 06/20/15  5:15 PM  Result Value Ref Range   Color, Urine YELLOW YELLOW   APPearance CLEAR CLEAR   Specific Gravity, Urine 1.020 1.005 - 1.030   pH 6.0 5.0 - 8.0   Glucose, UA NEGATIVE NEGATIVE mg/dL   Hgb urine dipstick NEGATIVE NEGATIVE   Bilirubin Urine NEGATIVE NEGATIVE   Ketones, ur 15 (A) NEGATIVE mg/dL   Protein, ur NEGATIVE NEGATIVE mg/dL   Nitrite NEGATIVE NEGATIVE   Leukocytes, UA NEGATIVE NEGATIVE  CBC     Status: Abnormal   Collection Time: 06/20/15  6:06 PM  Result Value Ref Range  WBC 11.7 (H) 4.0 - 10.5 K/uL   RBC 4.17 3.87 - 5.11 MIL/uL   Hemoglobin 9.1 (L) 12.0 - 15.0 g/dL   HCT 81.131.0 (L) 91.436.0 - 78.246.0 %   MCV 74.3 (L) 78.0 - 100.0 fL   MCH 21.8 (L) 26.0 - 34.0 pg   MCHC 29.4 (L) 30.0 - 36.0 g/dL   RDW 95.618.1 (H) 21.311.5 - 08.615.5 %   Platelets 394 150 - 400 K/uL  Comprehensive metabolic panel     Status: Abnormal   Collection Time: 06/20/15  6:06 PM  Result Value Ref Range   Sodium 135 135 - 145 mmol/L   Potassium 3.7 3.5 - 5.1 mmol/L   Chloride 107 101 - 111 mmol/L   CO2 19 (L) 22 - 32 mmol/L   Glucose, Bld 113 (H) 65 - 99 mg/dL   BUN 10 6 - 20 mg/dL   Creatinine, Ser 5.780.52 0.44 - 1.00 mg/dL   Calcium 8.5 (L) 8.9 - 10.3 mg/dL   Total Protein 7.2 6.5 - 8.1 g/dL   Albumin 2.5 (L) 3.5 - 5.0 g/dL   AST 34 15 - 41 U/L   ALT 18  14 - 54 U/L   Alkaline Phosphatase 247 (H) 38 - 126 U/L   Total Bilirubin 0.3 0.3 - 1.2 mg/dL   GFR calc non Af Amer >60 >60 mL/min   GFR calc Af Amer >60 >60 mL/min   Anion gap 9 5 - 15    Fetal Monitoring: Baseline: 130 bpm, moderate variability, + accelerations, no decelerations Contractions: mild UI  MAU Course  Procedures None  MDM UA, CBC, CMP and Bile Acids today Discussed with Dr. Shawnie PonsPratt. Discharge with Rx for Atarax. Will check results tomorrow and contact patient if induction is needed.  Assessment and Plan  A: SIUP at 2377w0d Diffuse pruritus without rash  P: Discharge home Rx for Atarax given to patient Labor precautions discussed Patient advised to follow-up with [facility] as scheduled or sooner PRN Will contact with abnormal results of pending bile acids Patient may return to MAU as needed or if her condition were to change or worsen   Marny LowensteinJulie N Wenzel, PA-C  06/20/2015, 7:50 PM

## 2015-06-20 NOTE — MAU Note (Signed)
Pt presents to MAU with complaints of itching all over since Tuesday. Denies any vaginal bleeding or LOF

## 2015-06-21 LAB — BILE ACIDS, TOTAL: Bile Acids Total: 9.5 umol/L (ref 4.7–24.5)

## 2015-06-24 ENCOUNTER — Encounter (HOSPITAL_COMMUNITY): Payer: Self-pay | Admitting: *Deleted

## 2015-06-24 ENCOUNTER — Inpatient Hospital Stay (HOSPITAL_COMMUNITY)
Admission: AD | Admit: 2015-06-24 | Discharge: 2015-06-26 | DRG: 775 | Disposition: A | Discharge status: Home / Self Care | Payer: Medicaid Other | Source: Ambulatory Visit | Attending: Obstetrics & Gynecology | Admitting: Obstetrics & Gynecology

## 2015-06-24 ENCOUNTER — Inpatient Hospital Stay (HOSPITAL_COMMUNITY): Payer: Medicaid Other | Admitting: Anesthesiology

## 2015-06-24 DIAGNOSIS — Z87891 Personal history of nicotine dependence: Secondary | ICD-10-CM

## 2015-06-24 DIAGNOSIS — O99344 Other mental disorders complicating childbirth: Secondary | ICD-10-CM | POA: Diagnosis present

## 2015-06-24 DIAGNOSIS — D649 Anemia, unspecified: Secondary | ICD-10-CM | POA: Diagnosis present

## 2015-06-24 DIAGNOSIS — Z6841 Body Mass Index (BMI) 40.0 and over, adult: Secondary | ICD-10-CM | POA: Diagnosis not present

## 2015-06-24 DIAGNOSIS — IMO0001 Reserved for inherently not codable concepts without codable children: Secondary | ICD-10-CM

## 2015-06-24 DIAGNOSIS — Z3A40 40 weeks gestation of pregnancy: Secondary | ICD-10-CM

## 2015-06-24 DIAGNOSIS — O0993 Supervision of high risk pregnancy, unspecified, third trimester: Secondary | ICD-10-CM

## 2015-06-24 DIAGNOSIS — O99214 Obesity complicating childbirth: Secondary | ICD-10-CM | POA: Diagnosis present

## 2015-06-24 DIAGNOSIS — O4292 Full-term premature rupture of membranes, unspecified as to length of time between rupture and onset of labor: Secondary | ICD-10-CM | POA: Diagnosis present

## 2015-06-24 DIAGNOSIS — O480 Post-term pregnancy: Principal | ICD-10-CM | POA: Diagnosis present

## 2015-06-24 DIAGNOSIS — F319 Bipolar disorder, unspecified: Secondary | ICD-10-CM | POA: Diagnosis present

## 2015-06-24 DIAGNOSIS — O9902 Anemia complicating childbirth: Secondary | ICD-10-CM

## 2015-06-24 DIAGNOSIS — O0990 Supervision of high risk pregnancy, unspecified, unspecified trimester: Secondary | ICD-10-CM

## 2015-06-24 DIAGNOSIS — O4290 Premature rupture of membranes, unspecified as to length of time between rupture and onset of labor, unspecified weeks of gestation: Secondary | ICD-10-CM | POA: Diagnosis present

## 2015-06-24 LAB — RPR: RPR Ser Ql: NONREACTIVE

## 2015-06-24 LAB — CBC
HCT: 31.2 % — ABNORMAL LOW (ref 36.0–46.0)
HEMOGLOBIN: 9.3 g/dL — AB (ref 12.0–15.0)
MCH: 22 pg — ABNORMAL LOW (ref 26.0–34.0)
MCHC: 29.8 g/dL — AB (ref 30.0–36.0)
MCV: 73.9 fL — ABNORMAL LOW (ref 78.0–100.0)
PLATELETS: 401 10*3/uL — AB (ref 150–400)
RBC: 4.22 MIL/uL (ref 3.87–5.11)
RDW: 18 % — AB (ref 11.5–15.5)
WBC: 14.7 10*3/uL — ABNORMAL HIGH (ref 4.0–10.5)

## 2015-06-24 LAB — TYPE AND SCREEN
ABO/RH(D): O POS
ANTIBODY SCREEN: NEGATIVE

## 2015-06-24 MED ORDER — DIBUCAINE 1 % RE OINT: 1.0000 "application " | TOPICAL_OINTMENT | RECTAL | Status: DC | PRN

## 2015-06-24 MED ORDER — TERBUTALINE SULFATE 1 MG/ML IJ SOLN
0.2500 mg | Freq: Once | INTRAMUSCULAR | Status: DC | PRN
Start: 2015-06-24 — End: 2015-06-24

## 2015-06-24 MED ORDER — TETANUS-DIPHTH-ACELL PERTUSSIS 5-2.5-18.5 LF-MCG/0.5 IM SUSP
0.5000 mL | Freq: Once | INTRAMUSCULAR | Status: DC
  Filled 2015-06-24: qty 0.5

## 2015-06-24 MED ORDER — OXYCODONE-ACETAMINOPHEN 5-325 MG PO TABS: 2.0000 | ORAL_TABLET | ORAL | Status: DC | PRN

## 2015-06-24 MED ORDER — ONDANSETRON HCL 4 MG/2ML IJ SOLN: 4.0000 mg | INTRAMUSCULAR | Status: DC | PRN

## 2015-06-24 MED ORDER — SIMETHICONE 80 MG PO CHEW: 80.0000 mg | CHEWABLE_TABLET | ORAL | Status: DC | PRN

## 2015-06-24 MED ORDER — LIDOCAINE HCL (PF) 1 % IJ SOLN
INTRAMUSCULAR | Status: DC | PRN
  Administered 2015-06-24 (×2): 5 mL

## 2015-06-24 MED ORDER — WITCH HAZEL-GLYCERIN EX PADS: 1.0000 "application " | MEDICATED_PAD | CUTANEOUS | Status: DC | PRN

## 2015-06-24 MED ORDER — DIPHENHYDRAMINE HCL 25 MG PO CAPS: 25.0000 mg | ORAL_CAPSULE | Freq: Four times a day (QID) | ORAL | Status: DC | PRN

## 2015-06-24 MED ORDER — BENZOCAINE-MENTHOL 20-0.5 % EX AERO
1.0000 "application " | INHALATION_SPRAY | CUTANEOUS | Status: DC | PRN
  Administered 2015-06-25: 1 via TOPICAL
  Filled 2015-06-24 (×2): qty 56

## 2015-06-24 MED ORDER — PRENATAL MULTIVITAMIN CH
1.0000 | ORAL_TABLET | Freq: Every day | ORAL | Status: DC
  Administered 2015-06-25: 1 via ORAL
  Filled 2015-06-24: qty 1

## 2015-06-24 MED ORDER — ACETAMINOPHEN 325 MG PO TABS
650.0000 mg | ORAL_TABLET | ORAL | Status: DC | PRN
  Administered 2015-06-25: 650 mg via ORAL
  Filled 2015-06-24: qty 2

## 2015-06-24 MED ORDER — LIDOCAINE HCL (PF) 1 % IJ SOLN: 30.0000 mL | INTRAMUSCULAR | Status: DC | PRN

## 2015-06-24 MED ORDER — ONDANSETRON HCL 4 MG/2ML IJ SOLN: 4.0000 mg | Freq: Four times a day (QID) | INTRAMUSCULAR | Status: DC | PRN

## 2015-06-24 MED ORDER — LACTATED RINGERS IV SOLN
INTRAVENOUS | Status: DC
  Administered 2015-06-24: 07:00:00 via INTRAVENOUS

## 2015-06-24 MED ORDER — OXYCODONE-ACETAMINOPHEN 5-325 MG PO TABS: 1.0000 | ORAL_TABLET | ORAL | Status: DC | PRN

## 2015-06-24 MED ORDER — FENTANYL 2.5 MCG/ML BUPIVACAINE 1/10 % EPIDURAL INFUSION (WH - ANES)
14.0000 mL/h | INTRAMUSCULAR | Status: DC | PRN
Start: 2015-06-24 — End: 2015-06-24
  Filled 2015-06-24: qty 125

## 2015-06-24 MED ORDER — ACETAMINOPHEN 325 MG PO TABS: 650.0000 mg | ORAL_TABLET | ORAL | Status: DC | PRN

## 2015-06-24 MED ORDER — FENTANYL 2.5 MCG/ML BUPIVACAINE 1/10 % EPIDURAL INFUSION (WH - ANES): 14.0000 mL/h | INTRAMUSCULAR | Status: DC | PRN

## 2015-06-24 MED ORDER — SENNOSIDES-DOCUSATE SODIUM 8.6-50 MG PO TABS
2.0000 | ORAL_TABLET | ORAL | Status: DC
  Administered 2015-06-25 – 2015-06-26 (×2): 2 via ORAL
  Filled 2015-06-24 (×2): qty 2

## 2015-06-24 MED ORDER — LACTATED RINGERS IV SOLN
INTRAVENOUS | Status: DC
  Administered 2015-06-24: 18:00:00 via INTRAUTERINE

## 2015-06-24 MED ORDER — EPHEDRINE 5 MG/ML INJ: 10.0000 mg | INTRAVENOUS | Status: DC | PRN

## 2015-06-24 MED ORDER — IBUPROFEN 600 MG PO TABS
600.0000 mg | ORAL_TABLET | Freq: Four times a day (QID) | ORAL | Status: DC
  Administered 2015-06-25 – 2015-06-26 (×5): 600 mg via ORAL
  Filled 2015-06-24 (×6): qty 1

## 2015-06-24 MED ORDER — ZOLPIDEM TARTRATE 5 MG PO TABS
5.0000 mg | ORAL_TABLET | Freq: Every evening | ORAL | Status: DC | PRN
Start: 2015-06-24 — End: 2015-06-26

## 2015-06-24 MED ORDER — OXYTOCIN BOLUS FROM INFUSION
500.0000 mL | INTRAVENOUS | Status: DC
  Administered 2015-06-24: 500 mL via INTRAVENOUS

## 2015-06-24 MED ORDER — DIPHENHYDRAMINE HCL 50 MG/ML IJ SOLN: 12.5000 mg | INTRAMUSCULAR | Status: DC | PRN

## 2015-06-24 MED ORDER — MEDROXYPROGESTERONE ACETATE 150 MG/ML IM SUSP
150.0000 mg | Freq: Once | INTRAMUSCULAR | Status: AC
  Administered 2015-06-25: 150 mg via INTRAMUSCULAR
  Filled 2015-06-24: qty 1

## 2015-06-24 MED ORDER — LACTATED RINGERS IV SOLN: 500.0000 mL | INTRAVENOUS | Status: DC | PRN

## 2015-06-24 MED ORDER — FENTANYL 2.5 MCG/ML BUPIVACAINE 1/10 % EPIDURAL INFUSION (WH - ANES)
INTRAMUSCULAR | Status: DC | PRN
  Administered 2015-06-24: 14 mL/h via EPIDURAL

## 2015-06-24 MED ORDER — LANOLIN HYDROUS EX OINT: TOPICAL_OINTMENT | CUTANEOUS | Status: DC | PRN

## 2015-06-24 MED ORDER — OXYTOCIN 40 UNITS IN LACTATED RINGERS INFUSION - SIMPLE MED
62.5000 mL/h | INTRAVENOUS | Status: DC
  Administered 2015-06-24: 62.5 mL/h via INTRAVENOUS

## 2015-06-24 MED ORDER — CITRIC ACID-SODIUM CITRATE 334-500 MG/5ML PO SOLN: 30.0000 mL | ORAL | Status: DC | PRN

## 2015-06-24 MED ORDER — PHENYLEPHRINE 40 MCG/ML (10ML) SYRINGE FOR IV PUSH (FOR BLOOD PRESSURE SUPPORT)
80.0000 ug | PREFILLED_SYRINGE | INTRAVENOUS | Status: DC | PRN
  Filled 2015-06-24: qty 20

## 2015-06-24 MED ORDER — OXYTOCIN 40 UNITS IN LACTATED RINGERS INFUSION - SIMPLE MED
1.0000 m[IU]/min | INTRAVENOUS | Status: DC
  Administered 2015-06-24: 2 m[IU]/min via INTRAVENOUS
  Filled 2015-06-24: qty 1000

## 2015-06-24 MED ORDER — ONDANSETRON HCL 4 MG PO TABS
4.0000 mg | ORAL_TABLET | ORAL | Status: DC | PRN
Start: 2015-06-24 — End: 2015-06-26

## 2015-06-24 NOTE — Progress Notes (Signed)
Labor Progress Note Deborah Hall is a [age] y.o. G3P2003 at 4117w4d presented for SROM, now being induced with pitocin. Doing well, comfortable with epidural.  S:   O:  BP 120/59 mmHg  Pulse 74  Temp(Src) 98.2 F (36.8 C) (Oral)  Resp 18  Ht 5' 3.5" (1.613 m)  Wt 146.33 kg (322 lb 9.6 oz)  BMI 56.24 kg/m2  SpO2 97%  LMP  (LMP Unknown) EFM: Deep variables, down to 50s with contractions. Ctx adequate on pit 15.   CVE: Dilation: 5.5 Effacement (%): 70 Cervical Position: Posterior Station: -2 Presentation: Vertex Exam by:: Dr.Gabrien Mentink   A&P: 19 y.o. G3P2003 7417w4d IOL for PROM, now with deep variable with contractions on pitocin. Patient's CVE repeated, no change from previous. Positional changes attempted - flat and on left side - and FHT improved with milder variables. Amnioinfusion initiated - 300cc bolus and 150 cc/hr.   Deborah CanterAmanda Madelena Maturin, MD 5:41 PM

## 2015-06-24 NOTE — Anesthesia Procedure Notes (Signed)
Epidural Patient location during procedure: OB Start time: 06/24/2015 1:10 PM End time: 06/24/2015 1:32 PM  Staffing Anesthesiologist: Sebastian AcheMANNY, Selah Klang  Preanesthetic Checklist Completed: patient identified, site marked, surgical consent, pre-op evaluation, timeout performed, IV checked, risks and benefits discussed and monitors and equipment checked  Epidural Patient position: sitting Prep: site prepped and draped and DuraPrep Patient monitoring: heart rate, continuous pulse ox and blood pressure Approach: midline Location: L3-L4 Injection technique: LOR air  Needle:  Needle type: Tuohy  Needle gauge: 17 G Needle length: 9 cm and 9 Needle insertion depth: 6.5 cm Catheter type: closed end flexible Catheter size: 19 Gauge Catheter at skin depth: 15 cm Test dose: negative  Assessment Events: blood not aspirated, injection not painful, no injection resistance, negative IV test and no paresthesia  Additional Notes   Patient tolerated the insertion well without complications.Reason for block:procedure for pain

## 2015-06-24 NOTE — Progress Notes (Signed)
Patient would like depo shot at discharge. Rn called Doctor for order.

## 2015-06-24 NOTE — H&P (Signed)
LABOR ADMISSION HISTORY AND PHYSICAL  Deborah Hall is a 19 y.o. female G75P2003 with IUP at [redacted]w[redacted]d  presenting for SROM :30AM. She reports +FM, + contractions, No LOF, no VB, no blurry vision, headaches or peripheral edema, and RUQ pain.  She plans on bottle feeding. She request IUD for birth control.  Dating: By 28w U/S --->  Estimated Date of Delivery: 06/20/15  Sono:    , CWD, normal anatomy, breech presentation, 1221 g, 50% EFW   Prenatal History/Complications: Late to prenatal care  Hx of bipolar/Depression  Anemia       Morbid Obesity   Past Medical History: Past Medical History  Diagnosis Date  . Obesity   . Bipolar disorder (HCC)   . Suicidal ideations   . Gonorrhea 2014 pregnancy     treated  . Anemia     Past Surgical History: Past Surgical History  Procedure Laterality Date  . No past surgeries      Obstetrical History: OB History    Gravida Para Term Preterm AB TAB SAB Ectopic Multiple Living   0 0 0 0 0 1 3      Social History: Social History   Social History  . Marital Status: Single    Spouse Name: N/A  . Number of Children: N/A  . Years of Education: N/A   Social History Main Topics  . Smoking status: Former Smoker    Quit date: 06/17/2012  . Smokeless tobacco: Never Used  . Alcohol Use: No  . Drug Use: No  . Sexual Activity: Yes    Birth Control/ Protection: None   Other Topics Concern  . None   Social History Narrative    Family History: Family History  Problem Relation Age of Onset  . Hypertension Mother   . Diabetes Father   . Cancer Neg Hx   . Heart disease Neg Hx   . Stroke Neg Hx     Allergies: No Known Allergies  Prescriptions prior to admission  Medication Sig Dispense Refill Last Dose  . acetaminophen (TYLENOL) 325 MG tablet Take 650 mg by mouth every 6 (six) hours as needed for mild pain or headache.   Past Month at Unknown time  . ferrous sulfate 325 (65 FE) MG tablet Take 325 mg by mouth daily.    06/23/2015 at Unknown time  . Prenatal Vit-Fe Fumarate-FA (PRENATAL MULTIVITAMIN) TABS tablet Take 1 tablet by mouth daily.   06/23/2015 at Unknown time  . hydrOXYzine (VISTARIL) 100 MG capsule Take 1 capsule (100 mg total) by mouth 3 (three) times daily as needed for itching. (Patient not taking: Reported on 06/24/2015) 30 capsule 0      Review of Systems   All systems reviewed and negative except as stated in HPI  Ht 5' 3.5" (1.613 m)  Wt 322 lb 9.6 oz (146.33 kg)  BMI 56.24 kg/m2  LMP  (LMP Unknown) General appearance: alert and cooperative Lungs: clear to auscultation bilaterally Heart: regular rate and rhythm Abdomen: soft, non-tender; bowel sounds normal Extremities: Homans sign is negative, no sign of DVT, edema Presentation: cephalic Fetal monitoringBaseline: 135 bpm, Variability: Good {> 6 bpm), Accelerations: Reactive and Decelerations: Absent Uterine activityNone Per patient 4 cm dilated at health department    Prenatal labs: ABO, Rh: --/--/O POS (09/11 0981) Antibody: NEG (09/11 0715) Rubella: !Error! RPR: Nonreactive (10/10 0000)  HBsAg: Negative (09/11 0715)  HIV: Non-reactive (09/11 0000)  GBS: Negative (11/09 0000)  1 hr Glucola 101 Genetic screening  None  Anatomy US Normal   Prenatal Transfer Tool  Maternal Diabetes: No Genetic Screening: Declined Maternal Ultrasounds/Referrals: Normal Fetal Ultrasounds or other Referrals:  None Maternal Substance Abuse:  No Significant Maternal Medications:  None Significant Maternal Lab Results: None  No results found for this or any previous visit (from the past 24 hour(s)).  Patient Active Problem List   Diagnosis Date Noted  . Supervision of high risk pregnancy, antepartum 04/02/2015  . Indication for care in labor or delivery 07/06/2013  . Bipolar disorder (HCC) 10/28/2011    Assessment: Deborah Hall is a 19 y.o. G3P2003 at 7671w4d here for SROM   #Labor: Pitocin vs  No augmentation #Pain: Epidural   #FWB: Catergory 1  #ID:  GBS negative  #MOF: Bottle  #MOC:IUD  #Circ:  N/A  Noralee CharsAsiyah Mikell, MD

## 2015-06-24 NOTE — MAU Note (Signed)
Pt states water broke at 0430-clear fluid. Denies contractions, but has some pressure. +FM Was 4cm 2 weeks ago.

## 2015-06-24 NOTE — Anesthesia Preprocedure Evaluation (Signed)
Anesthesia Evaluation  Patient identified by MRN, date of birth, ID band Patient awake and Patient confused    Reviewed: Allergy & Precautions, H&P , NPO status , Patient's Chart, lab work & pertinent test results  Airway Mallampati: II       Dental  (+) Dental Advidsory Given   Pulmonary former smoker,    Pulmonary exam normal breath sounds clear to auscultation       Cardiovascular Exercise Tolerance: Good Normal cardiovascular exam Rhythm:regular Rate:Normal     Neuro/Psych    GI/Hepatic   Endo/Other  Morbid obesityBMI 55.6  Renal/GU      Musculoskeletal   Abdominal   Peds  Hematology   Anesthesia Other Findings   Reproductive/Obstetrics (+) Pregnancy                             Anesthesia Physical Anesthesia Plan  ASA: III  Anesthesia Plan: Epidural   Post-op Pain Management:    Induction:   Airway Management Planned:   Additional Equipment:   Intra-op Plan:   Post-operative Plan:   Informed Consent: I have reviewed the patients History and Physical, chart, labs and discussed the procedure including the risks, benefits and alternatives for the proposed anesthesia with the patient or authorized representative who has indicated his/her understanding and acceptance.     Plan Discussed with:   Anesthesia Plan Comments:         Anesthesia Quick Evaluation

## 2015-06-24 NOTE — MAU Note (Addendum)
PT  SAYS SHE WAS LAYING  DOWN   - NO SLEEP,   AT 0420-   FLUID  RAN  DOWN  LEG-     PNC  WITH HD-  VE -  4  CM.     DENIES HSV AND MRSA.   [facility]G  [facility]    [facility]  [facility]  ON 12-8

## 2015-06-24 NOTE — Progress Notes (Signed)
Assumed care of pt at 1912, svd at 1914.

## 2015-06-24 NOTE — Progress Notes (Signed)
Patient ID: Deborah RobertsKayla Hall, female   DOB: August 26, 1995, 19 y.o.   MRN: [id number] Doing well, increasing contractions  Filed Vitals:   06/24/15 0855 06/24/15 1029 06/24/15 1100 06/24/15 1131  BP: 98/51 118/55 104/66 106/56  Pulse: 80 81 84 84  Temp: 98.4 F (36.9 C)   98.8 F (37.1 C)  TempSrc: Oral   Oral  Resp: 18     Height:      Weight:       FHR reactive UCs every 2-4 min  IUPC inserted Dilation: 3 Effacement (%): Thick Cervical Position: Posterior Station: -3 Presentation: Vertex Exam by:: Dr. Tomasa BlaseSchultz  Will continue to observe

## 2015-06-24 NOTE — Progress Notes (Signed)
Patient ID: Deborah RobertsKayla Hall, female   DOB: 01-Dec-1995, 19 y.o.   MRN: [id number] Now has epidural  Filed Vitals:   06/24/15 1347 06/24/15 1402 06/24/15 1432 06/24/15 1502  BP: 117/66 121/65 115/71 116/55  Pulse: 84 77 77 75  Temp:      TempSrc:      Resp:      Height:      Weight:      SpO2:       FHR reactive UCs every 2-4 min  Dilation: 4 Effacement (%): 50 Cervical Position: Posterior Station: -3 Presentation: Vertex Exam by:: J.Cox, RN  Continue to observe

## 2015-06-25 NOTE — Progress Notes (Cosign Needed)
Subjective:  Deborah Hall is a 19 y.o. [id number] 6828w4d s/p SVD with augmentation after PROM.  No acute events overnight.  Pt denies problems with ambulating, voiding or po intake.  She denies nausea or vomiting.  Pain is well controlled.  She has had flatus.  Lochia: Moderate, admits to 3 large clots last night.  Plan for birth control is Depo-Provera.  Method of Feeding: Bottle.   Objective: Blood pressure 115/59, pulse 79, temperature 98.3 F (36.8 C), temperature source Oral, resp. rate 18, height 5' 3.5" (1.613 m), weight 146.33 kg (322 lb 9.6 oz), SpO2 97 %, unknown if currently breastfeeding.  Physical Exam:  General: alert, cooperative and no distress Lochia:normal flow Chest: normal WOB Heart: Regular rate Abdomen: +BS, soft, mild TTP (appropriate) Uterine Fundus: firm, below umbillicus DVT Evaluation: No evidence of DVT seen on physical exam. Extremities: No edema   Recent Labs  06/24/15 0725  HGB 9.3*  HCT 31.2*    Assessment/Plan:  ASSESSMENT: Deborah Hall is a 19 y.o. [id number] 3128w4d s/p SVD with augmentation after PROM.  Plan for discharge tomorrow and Contraception Depo provera injection Continue routine PP care Breastfeeding support PRN  LOS: 1 day   Olivia Cantermanda Schultz 06/25/2015, 7:29 AM

## 2015-06-25 NOTE — Anesthesia Postprocedure Evaluation (Signed)
Anesthesia Post Note  Patient: Deborah Hall  Procedure(s) Performed: * No procedures listed *  Patient location during evaluation: Mother Baby Anesthesia Type: Epidural Level of consciousness: awake and alert Pain management: pain level controlled Vital Signs Assessment: post-procedure vital signs reviewed and stable Respiratory status: spontaneous breathing Cardiovascular status: stable Postop Assessment: no headache, no backache, epidural receding, patient able to bend at knees and no signs of nausea or vomiting Anesthetic complications: no    Last Vitals:  Filed Vitals:   06/25/15 0027 06/25/15 0517  BP: 137/60 115/59  Pulse: 82 79  Temp: 36.9 C 36.8 C  Resp:  18    Last Pain:  Filed Vitals:   06/25/15 0656  PainSc: 5                  Edison PaceWILKERSON,Froilan Mclean

## 2015-06-25 NOTE — Clinical Social Work Maternal (Signed)
CLINICAL SOCIAL WORK MATERNAL/CHILD NOTE  Patient Details  Name: Deborah Hall MRN: [id number] Date of Birth: 1995-11-16  Date:  06/25/2015  Clinical Social Worker Initiating Note:  Deborah Hall MSW, LCSW Date/ Time Initiated:  06/25/15/1300    Child's Name:  Deborah Hall   Legal Guardian:  Deborah Hall and Deborah Hall  Need for Interpreter:  None   Date of Referral:  06/24/15     Reason for Referral:  Late or No Prenatal Care , History of bipolar  Referral Source:  [facility]   Address:  [address] [place],  [address]  Phone number:  [phone]   Household Members:  Minor Children, Parents   Natural Supports (not living in the home):  Immediate Family   Professional Supports: LCSW at [employer]  Employment: Student   Type of Work:     Education:  Currently in [place]:  Medicaid   Other Resources:  Physicist, medical , [place] Considerations Which May Impact Care:  None reported  Strengths:  Ability to meet basic needs , Home prepared for child    Risk Factors/Current Problems:   1)Mental Health Concerns: MOB presents with history of bipolar and postpartum depression.    Cognitive State:  Able to Concentrate , Alert , Goal Oriented , Linear Thinking    Mood/Affect:  Bright , Calm , Comfortable , Happy    CSW Assessment:  CSW received request for consult due to MOB presenting with late prenatal care and bipolar.  CSW attempted to meet with MOB in the morning, but she was difficult to arouse. This afternoon, MOB displayed a full range in affect, was in a pleasant mood, and was receptive to [place] visit. MOB readily engaged with CSW and was observed to be caring for and attending to the infant.   MOB openly discussed how she contemplated placing the infant up for adoption up until the moment that the infant was born.  Per MOB, she has three other children (6 year old and 78 year old twins).   She discussed how she was unsure if she was capable of caring for a fourth child due to financial stressors and her perceptions of a limited support system.  MOB shared that during the pregnancy, she contemplated adoption and began to research agencies. She discussed how she was still uncertain when she went into labor, but all thoughts of adoption disappeared when she met the infant. MOB shared that she instantly fell in love with the infant, and could not imagine not bringing the infant home.  During the pregnancy, she stated that her mother has been telling her that she is present and able to support her with this infant.  She also reported that she has cousins who are actively involved who have also offered to support her.  MOB reflected upon her support system and shared that she feels "better" and more "prepared" knowing that she is not alone and that there are other people who want to help her.  Per MOB, she is not in a romantic relationship with the FOB, but shared that he is supportive as a co-parent. She stated that he currently lives in [place] [facility], but identified his level of support as ideal.    MOB reported diagnosis of bipolar at age 89. She stated that she was placed in numerous group homes from ages [age] due to her mental health diagnoses.  Per MOB, she did not experience any mental health complications after her first  child was born in December 2014, but reported "bad" postpartum depression after her twins were born.  MOB reported that she "knew" that something was "wrong" in regards to her mental health, and shared that she sought out therapy and medication management.  Per MOB, treatment assisted with the resolution of symptoms, but stated that all medications were discontinued when she learned that she was pregnant.  MOB denied acute depressive symptoms during this pregnancy. MOB shared that she experienced mood swings, denied belief that symptoms negatively impacted her ability to complete daily  household activities/responsibilies. MOB expressed concern about her transition postpartum with this infant, and shared that for this reason, she met with the LCSW at the [facility] Department to discuss potential mental health providers. MOB reported intention to schedule a follow up appointment as soon as possible in an effort to establish mental health care.  She discussed at length about how she wants to avoid postpartum depression since she never wants to feel that way again.  MOB was unable to identify any barriers to accessing care, and discussed at length her impressions of the desired potential outcomes if she were able to begin therapy and medication management. MOB denied need for referrals at this time since she has already received them from the LCSW at the health department.   MOB reported late prenatal care was a result of difficulties obtaining Medicaid. MOB denied any additional barriers to care. She denied questions or concerns related to the hospital drug screen policy, and denied history of substance use.   MOB expressed appreciation for the visit, acknowledged ongoing CSW availability, and agreed to contact CSW if needs arise.   CSW Plan/Description:   1)Patient/Family Education: Perinatal mood and anxiety disorders, hospital drug screen policy 2) CSW to monitor infant's toxicology screens, and will make CPS report if positive.  3)No Further Intervention Required/No Barriers to Discharge    Deborah Hall 06/25/2015, 3:10 PM

## 2015-06-25 NOTE — Progress Notes (Signed)
UR chart review completed.  

## 2015-06-26 DIAGNOSIS — IMO0001 Reserved for inherently not codable concepts without codable children: Secondary | ICD-10-CM

## 2015-06-26 MED ORDER — IBUPROFEN 600 MG PO TABS: 600.0000 mg | ORAL_TABLET | Freq: Four times a day (QID) | ORAL | Status: AC

## 2015-06-26 NOTE — Discharge Summary (Signed)
OB Discharge Summary     Patient Name: Deborah Hall DOB: [birth year] MRN: [id number]  Date of admission: 06/24/2015 Delivering MD: Olivia CanterSCHULTZ, AMANDA   Date of discharge: 06/26/2015  Admitting diagnosis: 40.4 WEEKS ROM Intrauterine pregnancy: 7156w4d     Secondary diagnosis:  Principal Problem:   Status post vaginal delivery Active Problems:   Bipolar disorder (HCC)   Supervision of high risk pregnancy, antepartum   PROM (premature rupture of membranes)   Prolonged rupture of membranes  Additional problems: None      Discharge diagnosis: Term Pregnancy Delivered                                                                                                Post partum procedures:None   Augmentation: Pitocin  Complications: None  Hospital course:  Onset of Labor With Vaginal Delivery     19 y.o. yo [id number] at 4256w4d was admitted in PROM on 06/24/2015 . Patient was initially in latent labor, started on pitocin to augment labor. Patient found to have deep variables with contractions on pitocin. Positional changes attempted - flat and on left side - and FHT improved with milder variables. Amnioinfusion initiated - 300cc bolus and 150 cc/hr.  Patient the delivered as follows:  Membrane Rupture Time/Date: 4:30 AM ,06/24/2015   Intrapartum Procedures: Episiotomy: None [1]                                         Lacerations:  None [1]  Patient had a delivery of a Viable infant. 06/24/2015  Information for the patient's newborn:  Deborah Hall, Girl Magalie [[id number]][[id number]]  Delivery Method: Vaginal, Spontaneous Delivery (Filed from Delivery Summary)    Pateint had an uncomplicated postpartum course.  She is ambulating, tolerating a regular diet, passing flatus, and urinating well. Patient is discharged home in stable condition on 06/26/2015  Physical exam  Filed Vitals:   06/25/15 0027 06/25/15 0517 06/25/15 1730 06/26/15 0545  BP: 137/60 115/59 124/59 92/39  Pulse: 82 79 78 72  Temp: 98.4 F  (36.9 C) 98.3 F (36.8 C) 98.1 F (36.7 C) 98.4 F (36.9 C)  TempSrc: Oral Oral  Oral  Resp:  18 18 20   Height:      Weight:      SpO2:       General: alert, cooperative and no distress Lochia: appropriate Uterine Fundus: soft Incision: N/A DVT Evaluation: No evidence of DVT seen on physical exam. Labs: Lab Results  Component Value Date   WBC 14.7* 06/24/2015   HGB 9.3* 06/24/2015   HCT 31.2* 06/24/2015   MCV 73.9* 06/24/2015   PLT 401* 06/24/2015   CMP Latest Ref Rng 06/20/2015  Glucose 65 - 99 mg/dL 696(E113(H)  BUN 6 - 20 mg/dL 10  Creatinine 9.520.44 - 8.411.00 mg/dL 3.240.52  Sodium 401135 - 027145 mmol/L 135  Potassium 3.5 - 5.1 mmol/L 3.7  Chloride 101 - 111 mmol/L 107  CO2 22 - 32 mmol/L 19(L)  Calcium 8.9 - 10.3 mg/dL 2.5(D8.5(L)  Total Protein 6.5 - 8.1 g/dL 7.2  Total Bilirubin 0.3 - 1.2 mg/dL 0.3  Alkaline Phos 38 - 126 U/L 247(H)  AST 15 - 41 U/L 34  ALT 14 - 54 U/L 18    Discharge instruction: per After Visit Summary and "Baby and Me Booklet".  After visit meds:    Medication List    ASK your doctor about these medications        acetaminophen 325 MG tablet  Commonly known as:  TYLENOL  Take 650 mg by mouth every 6 (six) hours as needed for mild pain or headache.     ferrous sulfate 325 (65 FE) MG tablet  Take 325 mg by mouth daily.     prenatal multivitamin Tabs tablet  Take 1 tablet by mouth daily.        Diet: routine diet  Activity: Advance as tolerated. Pelvic rest for 6 weeks.   Outpatient follow up:6 weeks Follow up Appt:No future appointments. Follow up Visit:No Follow-up on file.  Postpartum contraception: Depo Provera, given prior to d/c  Newborn Data: Live born female  Birth Weight: 7 lb 5.1 oz (3320 g) APGAR: 8, 9  Baby Feeding: Bottle Disposition:home with mother   06/26/2015 Deborah Angelene Giovanni, MD   OB FELLOW DISCHARGE ATTESTATION  I have seen and examined this patient and agree with above documentation in the resident's note.   Silvano Bilis, MD 7:22 AM

## 2015-06-26 NOTE — Discharge Instructions (Signed)

## 2015-06-27 ENCOUNTER — Inpatient Hospital Stay (HOSPITAL_COMMUNITY): Admission: RE | Admit: 2015-06-27 | Payer: Medicaid Other | Source: Ambulatory Visit

## 2015-06-30 NOTE — H&P (Cosign Needed)
LABOR ADMISSION HISTORY AND PHYSICAL  Deborah Hall is a 19 y.o. female G3P2003 with IUP at [redacted]w[redacted]d  presenting for [facility]OM @4:30AM. She reports +FM, + contractions, No LOF, no VB, no blurry vision, headaches or peripheral edema, and RUQ pain.  She plans on bottle feeding. She request IUD for birth control.  Dating: By 28w U/S --->  Estimated Date of Delivery: 06/20/15  Sono:    @28w, CWD, normal anatomy, breech presentation, 1221 g, 50% EFW   Prenatal History/Complications: Late to prenatal care  Hx of bipolar/Depression  Anemia       Morbid Obesity   Past Medical History: Past Medical History  Diagnosis Date  . Obesity   . Bipolar disorder (HCC)   . Suicidal ideations   . Gonorrhea 2014 pregnancy     treated  . Anemia     Past Surgical History: Past Surgical History  Procedure Laterality Date  . No past surgeries      Obstetrical History: OB History    Gravida Para Term Preterm AB TAB SAB Ectopic Multiple Living   3 2 2 0 0 0 0 0 1 3      Social History: Social History   Social History  . Marital Status: Single    Spouse Name: N/A  . Number of Children: N/A  . Years of Education: N/A   Social History Main Topics  . Smoking status: Former Smoker    Quit date: 06/17/2012  . Smokeless tobacco: Never Used  . Alcohol Use: No  . Drug Use: No  . Sexual Activity: Yes    Birth Control/ Protection: None   Other Topics Concern  . None   Social History Narrative    Family History: Family History  Problem Relation Age of Onset  . Hypertension Mother   . Diabetes Father   . Cancer Neg Hx   . Heart disease Neg Hx   . Stroke Neg Hx     Allergies: No Known Allergies  Prescriptions prior to admission  Medication Sig Dispense Refill Last Dose  . acetaminophen (TYLENOL) 325 MG tablet Take 650 mg by mouth every 6 (six) hours as needed for mild pain or headache.   Past Month at Unknown time  . ferrous sulfate 325 (65 FE) MG tablet Take 325 mg by mouth daily.    06/23/2015 at Unknown time  . Prenatal Vit-Fe Fumarate-FA (PRENATAL MULTIVITAMIN) TABS tablet Take 1 tablet by mouth daily.   06/23/2015 at Unknown time  . hydrOXYzine (VISTARIL) 100 MG capsule Take 1 capsule (100 mg total) by mouth 3 (three) times daily as needed for itching. (Patient not taking: Reported on 06/24/2015) 30 capsule 0      Review of Systems   All systems reviewed and negative except as stated in HPI  Ht 5' 3.5" (1.613 m)  Wt 322 lb 9.6 oz (146.33 kg)  BMI 56.24 kg/m2  LMP  (LMP Unknown) General appearance: alert and cooperative Lungs: clear to auscultation bilaterally Heart: regular rate and rhythm Abdomen: soft, non-tender; bowel sounds normal Extremities: Homans sign is negative, no sign of DVT, edema Presentation: cephalic Fetal monitoringBaseline: 135 bpm, Variability: Good {> 6 bpm), Accelerations: Reactive and Decelerations: Absent Uterine activityNone Per patient 4 cm dilated at health department    Prenatal labs: ABO, Rh: --/--/O POS (09/11 0715) Antibody: NEG (09/11 0715) Rubella: !Error! RPR: Nonreactive (10/10 0000)  HBsAg: Negative (09/11 0715)  HIV: Non-reactive (09/11 0000)  GBS: Negative (11/09 0000)  1 hr Glucola 101 Genetic screening    None  Anatomy US Normal   Prenatal Transfer Tool  Maternal Diabetes: No Genetic Screening: Declined Maternal Ultrasounds/Referrals: Normal Fetal Ultrasounds or other Referrals:  None Maternal Substance Abuse:  No Significant Maternal Medications:  None Significant Maternal Lab Results: None  No results found for this or any previous visit (from the past 24 hour(s)).  Patient Active Problem List   Diagnosis Date Noted  . Supervision of high risk pregnancy, antepartum 04/02/2015  . Indication for care in labor or delivery 07/06/2013  . Bipolar disorder (HCC) 10/28/2011    Assessment: Deborah Hall is a 19 y.o. G3P2003 at [redacted]w[redacted]d here for SROM   #Labor: Pitocin vs  No augmentation #Pain: Epidural   #FWB: Caterg[address] 1  #ID:  GBS negative  #MOF: Bottle  #MOC:IUD  #Circ:  N/A  Asiyah Mikell, MD
